# Patient Record
Sex: Male | Born: 1980 | Race: White | Hispanic: No | Marital: Single | Smoking: Never smoker
Health system: Southern US, Community
[De-identification: ages and names within clinical notes are randomized; demographics above are authoritative.]

---

## 2011-03-15 ENCOUNTER — Other Ambulatory Visit: Payer: Self-pay | Admitting: Chiropractic Medicine

## 2011-03-15 ENCOUNTER — Ambulatory Visit
Admission: RE | Admit: 2011-03-15 | Discharge: 2011-03-15 | Disposition: A | Discharge status: Home / Self Care | Payer: 59 | Source: Ambulatory Visit | Attending: Chiropractic Medicine | Admitting: Chiropractic Medicine

## 2011-03-15 DIAGNOSIS — M545 Low back pain: Secondary | ICD-10-CM

## 2011-03-15 DIAGNOSIS — M5412 Radiculopathy, cervical region: Secondary | ICD-10-CM

## 2013-09-07 ENCOUNTER — Encounter: Payer: Self-pay | Admitting: Sports Medicine

## 2013-09-07 ENCOUNTER — Ambulatory Visit (INDEPENDENT_AMBULATORY_CARE_PROVIDER_SITE_OTHER): Payer: PRIVATE HEALTH INSURANCE | Admitting: Sports Medicine

## 2013-09-07 VITALS — BP 134/87 | HR 68 | Ht 68.0 in | Wt 233.0 lb

## 2013-09-07 DIAGNOSIS — Z0000 Encounter for general adult medical examination without abnormal findings: Secondary | ICD-10-CM

## 2013-09-07 DIAGNOSIS — Z299 Encounter for prophylactic measures, unspecified: Secondary | ICD-10-CM

## 2013-09-07 DIAGNOSIS — Z23 Encounter for immunization: Secondary | ICD-10-CM

## 2013-09-07 MED ORDER — TETANUS-DIPHTH-ACELL PERTUSSIS 5-2.5-18.5 LF-MCG/0.5 IM SUSP
0.5000 mL | Freq: Once | INTRAMUSCULAR | Status: AC
  Administered 2013-09-07: 0.5 mL via INTRAMUSCULAR

## 2013-09-07 NOTE — Assessment & Plan Note (Signed)
Complete physical as above. Tdap given today. Return in a year.

## 2013-09-07 NOTE — Progress Notes (Signed)
  Subjective:    CC: Establish care.   HPI:  This pleasant 33 year old male is here for a work physical, he has no complaints and is healthy.  Past medical history, Surgical history, Family history not pertinant except as noted below, Social history, Allergies, and medications have been entered into the medical record, reviewed, and no changes needed.   Review of Systems: No headache, visual changes, nausea, vomiting, diarrhea, constipation, dizziness, abdominal pain, skin rash, fevers, chills, night sweats, swollen lymph nodes, weight loss, chest pain, body aches, joint swelling, muscle aches, shortness of breath, mood changes, visual or auditory hallucinations.  Objective:    General: Well Developed, well nourished, and in no acute distress.  Neuro: Alert and oriented x3, extra-ocular muscles intact, sensation grossly intact.  HEENT: Normocephalic, atraumatic, pupils equal round reactive to light, neck supple, no masses, no lymphadenopathy, thyroid nonpalpable. Oropharynx, nasopharynx, external ear canals are unremarkable. Skin: Warm and dry, no rashes noted.  Cardiac: Regular rate and rhythm, no murmurs rubs or gallops.  Respiratory: Clear to auscultation bilaterally. Not using accessory muscles, speaking in full sentences.  Abdominal: Soft, nontender, nondistended, positive bowel sounds, no masses, no organomegaly.  Musculoskeletal: Shoulder, elbow, wrist, hip, knee, ankle stable, and with full range of motion.  Impression and Recommendations:    The patient was counselled, risk factors were discussed, anticipatory guidance given.

## 2014-09-16 ENCOUNTER — Encounter: Payer: Self-pay | Admitting: Sports Medicine

## 2014-09-16 ENCOUNTER — Ambulatory Visit (INDEPENDENT_AMBULATORY_CARE_PROVIDER_SITE_OTHER): Payer: PRIVATE HEALTH INSURANCE | Admitting: Sports Medicine

## 2014-09-16 VITALS — BP 124/87 | HR 77 | Ht 68.0 in | Wt 236.0 lb

## 2014-09-16 DIAGNOSIS — E669 Obesity, unspecified: Secondary | ICD-10-CM

## 2014-09-16 DIAGNOSIS — Z0000 Encounter for general adult medical examination without abnormal findings: Secondary | ICD-10-CM | POA: Diagnosis not present

## 2014-09-16 DIAGNOSIS — L918 Other hypertrophic disorders of the skin: Secondary | ICD-10-CM | POA: Insufficient documentation

## 2014-09-16 MED ORDER — PHENTERMINE HCL 37.5 MG PO TABS: ORAL_TABLET | ORAL | Status: DC

## 2014-09-16 NOTE — Assessment & Plan Note (Signed)
Cryotherapy as above. 

## 2014-09-16 NOTE — Progress Notes (Signed)
  Subjective:    CC: CPE  HPI:  Healthy male, no complaints except for occasional snoring, he also has been trying to lose significant weight with dieting and exercise but unfortunately his schedule makes it difficult for him to eat regularly, he works second shift, tends to come home and eat junk food.  Skin tag: Left-sided upper shoulder, like this removed.  Past medical history, Surgical history, Family history not pertinant except as noted below, Social history, Allergies, and medications have been entered into the medical record, reviewed, and no changes needed.   Review of Systems: No headache, visual changes, nausea, vomiting, diarrhea, constipation, dizziness, abdominal pain, skin rash, fevers, chills, night sweats, swollen lymph nodes, weight loss, chest pain, body aches, joint swelling, muscle aches, shortness of breath, mood changes, visual or auditory hallucinations.  Objective:    General: Well Developed, well nourished, and in no acute distress.  Neuro: Alert and oriented x3, extra-ocular muscles intact, sensation grossly intact. Cranial nerves II through XII are intact, motor, sensory, and coordinative functions are all intact. HEENT: Normocephalic, atraumatic, pupils equal round reactive to light, neck supple, no masses, no lymphadenopathy, thyroid nonpalpable. Oropharynx, nasopharynx, external ear canals are unremarkable. He does have fairly large tonsils. Skin: Warm and dry, no rashes noted. Skin tag on the left upper shoulder Cardiac: Regular rate and rhythm, no murmurs rubs or gallops.  Respiratory: Clear to auscultation bilaterally. Not using accessory muscles, speaking in full sentences.  Abdominal: Soft, nontender, nondistended, positive bowel sounds, no masses, no organomegaly.  Musculoskeletal: Shoulder, elbow, wrist, hip, knee, ankle stable, and with full range of motion.  Procedure:  Cryodestruction of left upper shoulder skin tag Consent obtained and  verified. Time-out conducted. Noted no overlying erythema, induration, or other signs of local infection. Completed without difficulty using Cryo-Gun. Advised to call if fevers/chills, erythema, induration, drainage, or persistent bleeding.  Impression and Recommendations:    The patient was counselled, risk factors were discussed, anticipatory guidance given.

## 2014-09-16 NOTE — Assessment & Plan Note (Signed)
Normal physical exam. Checking routine blood work.

## 2014-09-16 NOTE — Assessment & Plan Note (Signed)
Starting phentermine. Goal weight is approximately 180-190 pounds. He does have some snoring and daytime sleepiness, we will help him to lose weight and if he still continues to have these symptoms we can discuss sleep apnea testing.

## 2014-09-17 LAB — CBC
HCT: 46.7 % (ref 39.0–52.0)
Hemoglobin: 16 g/dL (ref 13.0–17.0)
MCH: 30 pg (ref 26.0–34.0)
MCHC: 34.3 g/dL (ref 30.0–36.0)
MCV: 87.6 fL (ref 78.0–100.0)
MPV: 8.4 fL — ABNORMAL LOW (ref 8.6–12.4)
Platelets: 226 K/uL (ref 150–400)
RBC: 5.33 MIL/uL (ref 4.22–5.81)
RDW: 13.9 % (ref 11.5–15.5)
WBC: 5.5 K/uL (ref 4.0–10.5)

## 2014-09-17 LAB — COMPREHENSIVE METABOLIC PANEL
Albumin: 4.2 g/dL (ref 3.5–5.2)
Alkaline Phosphatase: 58 U/L (ref 39–117)
BUN: 16 mg/dL (ref 6–23)
Glucose, Bld: 97 mg/dL (ref 70–99)
Sodium: 143 mEq/L (ref 135–145)
Total Bilirubin: 0.9 mg/dL (ref 0.2–1.2)

## 2014-09-17 LAB — COMPREHENSIVE METABOLIC PANEL WITH GFR
ALT: 11 U/L (ref 0–53)
AST: 19 U/L (ref 0–37)
CO2: 28 meq/L (ref 19–32)
Calcium: 9.1 mg/dL (ref 8.4–10.5)
Chloride: 104 meq/L (ref 96–112)
Creat: 1.18 mg/dL (ref 0.50–1.35)
Potassium: 4.3 meq/L (ref 3.5–5.3)
Total Protein: 7.1 g/dL (ref 6.0–8.3)

## 2014-09-17 LAB — LIPID PANEL
Cholesterol: 213 mg/dL — ABNORMAL HIGH (ref 0–200)
HDL: 43 mg/dL (ref >=40)
LDL Cholesterol: 149 mg/dL — ABNORMAL HIGH (ref 0–99)
Total CHOL/HDL Ratio: 5 ratio
Triglycerides: 106 mg/dL (ref <150)
VLDL: 21 mg/dL (ref 0–40)

## 2014-09-17 LAB — HEMOGLOBIN A1C
Hgb A1c MFr Bld: 5.6 % (ref <5.7)
Mean Plasma Glucose: 114 mg/dL (ref <117)

## 2014-09-17 LAB — TSH: TSH: 3.101 u[IU]/mL (ref 0.350–4.500)

## 2014-10-14 ENCOUNTER — Ambulatory Visit (INDEPENDENT_AMBULATORY_CARE_PROVIDER_SITE_OTHER): Payer: PRIVATE HEALTH INSURANCE | Admitting: Sports Medicine

## 2014-10-14 ENCOUNTER — Encounter: Payer: Self-pay | Admitting: Sports Medicine

## 2014-10-14 VITALS — BP 112/81 | HR 92 | Ht 68.0 in | Wt 230.0 lb

## 2014-10-14 DIAGNOSIS — E669 Obesity, unspecified: Secondary | ICD-10-CM

## 2014-10-14 MED ORDER — PHENTERMINE HCL 37.5 MG PO TABS: 37.5000 mg | ORAL_TABLET | Freq: Every day | ORAL | Status: DC

## 2014-10-14 NOTE — Assessment & Plan Note (Signed)
6 pound weight loss after the first month on phentermine. Again discussed the importance of cutting back carbohydrates, he will be getting back into the gym. Weight is 180-190 pounds, he will work harder this next month and try to get 10 pounds down. We are entering the second month on phentermine

## 2014-10-14 NOTE — Progress Notes (Signed)
  Subjective:    CC: Weight check  HPI: Obesity: 6 pound weight loss after the first month on phentermine, no side effects, no chest pain, sleeping okay, no concerns, no complaints.  Past medical history, Surgical history, Family history not pertinant except as noted below, Social history, Allergies, and medications have been entered into the medical record, reviewed, and no changes needed.   Review of Systems: No fevers, chills, night sweats, weight loss, chest pain, or shortness of breath.   Objective:    General: Well Developed, well nourished, and in no acute distress.  Neuro: Alert and oriented x3, extra-ocular muscles intact, sensation grossly intact.  HEENT: Normocephalic, atraumatic, pupils equal round reactive to light, neck supple, no masses, no lymphadenopathy, thyroid nonpalpable.  Skin: Warm and dry, no rashes. Cardiac: Regular rate and rhythm, no murmurs rubs or gallops, no lower extremity edema.  Respiratory: Clear to auscultation bilaterally. Not using accessory muscles, speaking in full sentences.  Impression and Recommendations:

## 2014-11-11 ENCOUNTER — Ambulatory Visit: Payer: PRIVATE HEALTH INSURANCE | Admitting: Sports Medicine

## 2015-08-04 ENCOUNTER — Encounter: Payer: PRIVATE HEALTH INSURANCE | Admitting: Sports Medicine

## 2015-08-11 ENCOUNTER — Encounter: Payer: Self-pay | Admitting: Sports Medicine

## 2015-08-11 ENCOUNTER — Ambulatory Visit (INDEPENDENT_AMBULATORY_CARE_PROVIDER_SITE_OTHER): Payer: PRIVATE HEALTH INSURANCE | Admitting: Sports Medicine

## 2015-08-11 VITALS — BP 106/71 | HR 66 | Resp 16 | Ht 66.0 in | Wt 231.6 lb

## 2015-08-11 DIAGNOSIS — Z0000 Encounter for general adult medical examination without abnormal findings: Secondary | ICD-10-CM | POA: Diagnosis not present

## 2015-08-11 DIAGNOSIS — E785 Hyperlipidemia, unspecified: Secondary | ICD-10-CM | POA: Diagnosis not present

## 2015-08-11 DIAGNOSIS — E669 Obesity, unspecified: Secondary | ICD-10-CM

## 2015-08-11 LAB — CBC
HCT: 46.8 % (ref 38.5–50.0)
Hemoglobin: 16.3 g/dL (ref 13.2–17.1)
MCH: 30.6 pg (ref 27.0–33.0)
MCHC: 34.8 g/dL (ref 32.0–36.0)
MCV: 87.8 fL (ref 80.0–100.0)
MPV: 8.2 fL (ref 7.5–12.5)
Platelets: 208 10*3/uL (ref 140–400)
RBC: 5.33 MIL/uL (ref 4.20–5.80)
RDW: 13.5 % (ref 11.0–15.0)
WBC: 5.9 10*3/uL (ref 3.8–10.8)

## 2015-08-11 LAB — HEMOGLOBIN A1C
Hgb A1c MFr Bld: 5.5 % (ref <5.7)
Mean Plasma Glucose: 111 mg/dL

## 2015-08-11 MED ORDER — PHENTERMINE HCL 37.5 MG PO TABS: ORAL_TABLET | ORAL | Status: DC

## 2015-08-11 NOTE — Assessment & Plan Note (Signed)
Complete physical as above. 

## 2015-08-11 NOTE — Assessment & Plan Note (Addendum)
Restarting phentermine,did get excessive sweating so he will start with one half tab daily for a week and then possibly go up to one half tab twice a day. Return monthly for weight checks, checking routine blood work. We discussed the mechanisms of the other weight loss medications today

## 2015-08-11 NOTE — Progress Notes (Signed)
  Subjective:    CC: CPE   HPI:  This is a pleasant 35 year old male here for a physical. He is fasting.  Obesity: Desires to restart weight loss treatment.  Past medical history, Surgical history, Family history not pertinant except as noted below, Social history, Allergies, and medications have been entered into the medical record, reviewed, and no changes needed.   Review of Systems: No headache, visual changes, nausea, vomiting, diarrhea, constipation, dizziness, abdominal pain, skin rash, fevers, chills, night sweats, swollen lymph nodes, weight loss, chest pain, body aches, joint swelling, muscle aches, shortness of breath, mood changes, visual or auditory hallucinations.  Objective:    General: Well Developed, well nourished, and in no acute distress.  Neuro: Alert and oriented x3, extra-ocular muscles intact, sensation grossly intact. Cranial nerves II through XII are intact, motor, sensory, and coordinative functions are all intact. HEENT: Normocephalic, atraumatic, pupils equal round reactive to light, neck supple, no masses, no lymphadenopathy, thyroid nonpalpable. Oropharynx, nasopharynx, external ear canals are unremarkable. Skin: Warm and dry, no rashes noted.  Cardiac: Regular rate and rhythm, no murmurs rubs or gallops.  Respiratory: Clear to auscultation bilaterally. Not using accessory muscles, speaking in full sentences.  Abdominal: Soft, nontender, nondistended, positive bowel sounds, no masses, no organomegaly.  Musculoskeletal: Shoulder, elbow, wrist, hip, knee, ankle stable, and with full range of motion.  Impression and Recommendations:    The patient was counselled, risk factors were discussed, anticipatory guidance given.

## 2015-08-12 DIAGNOSIS — E785 Hyperlipidemia, unspecified: Secondary | ICD-10-CM | POA: Insufficient documentation

## 2015-08-12 LAB — TSH: TSH: 2.22 mIU/L (ref 0.40–4.50)

## 2015-08-12 LAB — COMPREHENSIVE METABOLIC PANEL
ALT: 9 U/L (ref 9–46)
AST: 17 U/L (ref 10–40)
Albumin: 4.3 g/dL (ref 3.6–5.1)
Alkaline Phosphatase: 50 U/L (ref 40–115)
BUN: 16 mg/dL (ref 7–25)
CO2: 25 mmol/L (ref 20–31)
Calcium: 9.3 mg/dL (ref 8.6–10.3)
Chloride: 100 mmol/L (ref 98–110)
Creat: 1.12 mg/dL (ref 0.60–1.35)
Glucose, Bld: 68 mg/dL (ref 65–99)
Potassium: 4.2 mmol/L (ref 3.5–5.3)
Sodium: 139 mmol/L (ref 135–146)
Total Bilirubin: 0.8 mg/dL (ref 0.2–1.2)
Total Protein: 7.1 g/dL (ref 6.1–8.1)

## 2015-08-12 LAB — LIPID PANEL
Cholesterol: 217 mg/dL — ABNORMAL HIGH (ref 125–200)
HDL: 49 mg/dL (ref >=40)
LDL Cholesterol: 143 mg/dL — ABNORMAL HIGH (ref <130)
Total CHOL/HDL Ratio: 4.4 ratio (ref <=5.0)
Triglycerides: 123 mg/dL (ref <150)
VLDL: 25 mg/dL (ref <30)

## 2015-08-12 LAB — VITAMIN D 25 HYDROXY (VIT D DEFICIENCY, FRACTURES): Vit D, 25-Hydroxy: 24 ng/mL — ABNORMAL LOW (ref 30–100)

## 2015-08-12 MED ORDER — VITAMIN D (ERGOCALCIFEROL) 1.25 MG (50000 UNIT) PO CAPS: 50000.0000 [IU] | ORAL_CAPSULE | ORAL | Status: DC

## 2015-08-12 NOTE — Addendum Note (Signed)
Addended by: Monica BectonHEKKEKANDAM, Demetrie Borge J on: 08/12/2015 08:37 AM   Modules accepted: Orders

## 2015-09-08 ENCOUNTER — Ambulatory Visit (INDEPENDENT_AMBULATORY_CARE_PROVIDER_SITE_OTHER): Payer: PRIVATE HEALTH INSURANCE | Admitting: Sports Medicine

## 2015-09-08 ENCOUNTER — Encounter: Payer: Self-pay | Admitting: Sports Medicine

## 2015-09-08 VITALS — BP 111/76 | HR 76 | Resp 16 | Wt 222.2 lb

## 2015-09-08 DIAGNOSIS — E669 Obesity, unspecified: Secondary | ICD-10-CM

## 2015-09-08 DIAGNOSIS — S76311A Strain of muscle, fascia and tendon of the posterior muscle group at thigh level, right thigh, initial encounter: Secondary | ICD-10-CM

## 2015-09-08 MED ORDER — PHENTERMINE HCL 37.5 MG PO TABS: 37.5000 mg | ORAL_TABLET | Freq: Every day | ORAL | Status: DC

## 2015-09-08 NOTE — Assessment & Plan Note (Signed)
9 pound weight loss after the first month on phentermine, continue medication as we enter the second month.

## 2015-09-08 NOTE — Progress Notes (Signed)
  Subjective:    CC: Follow-up  HPI: Obesity: 9 pound weight loss after the first month, no longer having excessive sweating.  Hamstring strain: While playing softball this weekend, pain is at the mid semimembranosus muscle belly. No bruising. Mild, persistent.  Past medical history, Surgical history, Family history not pertinant except as noted below, Social history, Allergies, and medications have been entered into the medical record, reviewed, and no changes needed.   Review of Systems: No fevers, chills, night sweats, weight loss, chest pain, or shortness of breath.   Objective:    General: Well Developed, well nourished, and in no acute distress.  Neuro: Alert and oriented x3, extra-ocular muscles intact, sensation grossly intact.  HEENT: Normocephalic, atraumatic, pupils equal round reactive to light, neck supple, no masses, no lymphadenopathy, thyroid nonpalpable.  Skin: Warm and dry, no rashes. Cardiac: Regular rate and rhythm, no murmurs rubs or gallops, no lower extremity edema.  Respiratory: Clear to auscultation bilaterally. Not using accessory muscles, speaking in full sentences. Right thigh: Tender to palpation over the mid semimembranosus muscle belly without palpable gap in the muscle, excellent strength hip extension and knee flexion. No visible bruising or other abnormalities.  The right thigh was strapped with compressive dressing.  Impression and Recommendations:    I spent 25 minutes with this patient, greater than 50% was face-to-face time counseling regarding the above diagnoses

## 2015-09-08 NOTE — Assessment & Plan Note (Signed)
Grade 1 strain. Strap with compressive dressing, rehabilitation exercises given.

## 2015-10-07 ENCOUNTER — Ambulatory Visit (INDEPENDENT_AMBULATORY_CARE_PROVIDER_SITE_OTHER): Payer: PRIVATE HEALTH INSURANCE | Admitting: Sports Medicine

## 2015-10-07 VITALS — BP 100/65 | HR 71 | Resp 18 | Wt 215.0 lb

## 2015-10-07 DIAGNOSIS — E669 Obesity, unspecified: Secondary | ICD-10-CM | POA: Diagnosis not present

## 2015-10-07 MED ORDER — PHENTERMINE HCL 37.5 MG PO TABS: 37.5000 mg | ORAL_TABLET | Freq: Every day | ORAL | Status: DC

## 2015-10-07 NOTE — Progress Notes (Signed)
  Subjective:    CC: Weight check  HPI: Good additional weight loss after 2 months of phentermine, no side effects.  Past medical history, Surgical history, Family history not pertinant except as noted below, Social history, Allergies, and medications have been entered into the medical record, reviewed, and no changes needed.   Review of Systems: No fevers, chills, night sweats, weight loss, chest pain, or shortness of breath.   Objective:    General: Well Developed, well nourished, and in no acute distress.  Neuro: Alert and oriented x3, extra-ocular muscles intact, sensation grossly intact.  HEENT: Normocephalic, atraumatic, pupils equal round reactive to light, neck supple, no masses, no lymphadenopathy, thyroid nonpalpable.  Skin: Warm and dry, no rashes. Cardiac: Regular rate and rhythm, no murmurs rubs or gallops, no lower extremity edema.  Respiratory: Clear to auscultation bilaterally. Not using accessory muscles, speaking in full sentences.  Impression and Recommendations:

## 2015-10-07 NOTE — Assessment & Plan Note (Signed)
Additional 7 pound weight loss after the second month of phentermine, continue medication.

## 2015-11-04 ENCOUNTER — Ambulatory Visit (INDEPENDENT_AMBULATORY_CARE_PROVIDER_SITE_OTHER): Payer: PRIVATE HEALTH INSURANCE | Admitting: Sports Medicine

## 2015-11-04 ENCOUNTER — Encounter: Payer: Self-pay | Admitting: Sports Medicine

## 2015-11-04 DIAGNOSIS — E669 Obesity, unspecified: Secondary | ICD-10-CM | POA: Diagnosis not present

## 2015-11-04 MED ORDER — VITAMIN D (ERGOCALCIFEROL) 1.25 MG (50000 UNIT) PO CAPS: 50000.0000 [IU] | ORAL_CAPSULE | ORAL | 0 refills | Status: DC

## 2015-11-04 MED ORDER — PHENTERMINE HCL 37.5 MG PO TABS: 37.5000 mg | ORAL_TABLET | Freq: Every day | ORAL | 0 refills | Status: DC

## 2015-11-04 NOTE — Progress Notes (Signed)
  Subjective:    CC: Weight check  HPI: This pleasant 35 year old male continues to lose weight, in fact she's only taken phentermine for the first 2 weeks of the last month. He does note some weakness after working out when he doesn't eat, and wonders if he can skip the phentermine and workout days.  He also tells me well doing to rows he felt a pain in his low back which continues to get better. No radiculopathy, bowel or bladder dysfunction, saddle numbness.  Past medical history, Surgical history, Family history not pertinant except as noted below, Social history, Allergies, and medications have been entered into the medical record, reviewed, and no changes needed.   Review of Systems: No fevers, chills, night sweats, weight loss, chest pain, or shortness of breath.   Objective:    General: Well Developed, well nourished, and in no acute distress.  Neuro: Alert and oriented x3, extra-ocular muscles intact, sensation grossly intact.  HEENT: Normocephalic, atraumatic, pupils equal round reactive to light, neck supple, no masses, no lymphadenopathy, thyroid nonpalpable.  Skin: Warm and dry, no rashes. Cardiac: Regular rate and rhythm, no murmurs rubs or gallops, no lower extremity edema.  Respiratory: Clear to auscultation bilaterally. Not using accessory muscles, speaking in full sentences.   Impression and Recommendations:    Obesity Good continued weight loss as we enter the fourth month, he is not even taking phentermine every day. We discussed holding off on phentermine on workout days and consuming a bit more.  I spent 25 minutes with this patient, greater than 50% was face-to-face time counseling regarding the above diagnoses

## 2015-11-04 NOTE — Assessment & Plan Note (Signed)
Good continued weight loss as we enter the fourth month, he is not even taking phentermine every day. We discussed holding off on phentermine on workout days and consuming a bit more.

## 2015-12-02 ENCOUNTER — Ambulatory Visit: Payer: PRIVATE HEALTH INSURANCE | Admitting: Sports Medicine

## 2016-04-10 ENCOUNTER — Ambulatory Visit (INDEPENDENT_AMBULATORY_CARE_PROVIDER_SITE_OTHER): Payer: PRIVATE HEALTH INSURANCE | Admitting: Sports Medicine

## 2016-04-10 DIAGNOSIS — M7662 Achilles tendinitis, left leg: Secondary | ICD-10-CM

## 2016-04-10 DIAGNOSIS — M6788 Other specified disorders of synovium and tendon, other site: Secondary | ICD-10-CM | POA: Insufficient documentation

## 2016-04-10 MED ORDER — DICLOFENAC SODIUM 75 MG PO TBEC: 75.0000 mg | DELAYED_RELEASE_TABLET | Freq: Two times a day (BID) | ORAL | 3 refills | Status: DC

## 2016-04-10 NOTE — Assessment & Plan Note (Signed)
This likely occurred with wearing 0 drop cleats well playing softball. He should continue his regular shoes, we will use a heel lift, rehabilitation exercises, diclofenac. He will ice his heels after further softball games.

## 2016-04-10 NOTE — Progress Notes (Signed)
  Subjective:    CC: Left heel pain  HPI: This is a pleasant 36 year old male, this weekend he was playing softball in 0 drop cleats. Unfortunately he developed severe pain on the posterior aspect of his left ankle, that worsened overnight, no swelling. Pain is severe, persistent, localized without radiation.  Past medical history:  Negative.  See flowsheet/record as well for more information.  Surgical history: Negative.  See flowsheet/record as well for more information.  Family history: Negative.  See flowsheet/record as well for more information.  Social history: Negative.  See flowsheet/record as well for more information.  Allergies, and medications have been entered into the medical record, reviewed, and no changes needed.   Review of Systems: No fevers, chills, night sweats, weight loss, chest pain, or shortness of breath.   Objective:    General: Well Developed, well nourished, and in no acute distress.  Neuro: Alert and oriented x3, extra-ocular muscles intact, sensation grossly intact.  HEENT: Normocephalic, atraumatic, pupils equal round reactive to light, neck supple, no masses, no lymphadenopathy, thyroid nonpalpable.  Skin: Warm and dry, no rashes. Cardiac: Regular rate and rhythm, no murmurs rubs or gallops, no lower extremity edema.  Respiratory: Clear to auscultation bilaterally. Not using accessory muscles, speaking in full sentences. Left Ankle: No visible erythema or swelling. Range of motion is full in all directions. Strength is 5/5 in all directions. Stable lateral and medial ligaments; squeeze test and kleiger test unremarkable; Talar dome nontender; No pain at base of 5th MT; No tenderness over cuboid; No tenderness over N spot or navicular prominence No tenderness on posterior aspects of lateral and medial malleolus No sign of peroneal tendon subluxations; Negative tarsal tunnel tinel's Exquisite tenderness to palpation at the insertion of the Achilles, no  tenderness at the retrocalcaneal bursa, no swelling.  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 He should continue his regular shoes, we will use a heel lift, rehabilitation exercises, diclofenac. He will ice his heels after further softball games.

## 2016-05-08 ENCOUNTER — Ambulatory Visit (INDEPENDENT_AMBULATORY_CARE_PROVIDER_SITE_OTHER): Payer: PRIVATE HEALTH INSURANCE | Admitting: Sports Medicine

## 2016-05-08 ENCOUNTER — Encounter: Payer: Self-pay | Admitting: Sports Medicine

## 2016-05-08 DIAGNOSIS — M7662 Achilles tendinitis, left leg: Secondary | ICD-10-CM | POA: Diagnosis not present

## 2016-05-08 DIAGNOSIS — M7581 Other shoulder lesions, right shoulder: Secondary | ICD-10-CM | POA: Diagnosis not present

## 2016-05-08 DIAGNOSIS — M6788 Other specified disorders of synovium and tendon, other site: Secondary | ICD-10-CM

## 2016-05-08 NOTE — Progress Notes (Signed)
  Subjective:    CC: Follow-up  HPI: Right shoulder pain: Localized over the deltoid, worse with overhead activities, moderate, persistent without radiation. No discrete injuries.  Achilles tendinosis: Resolved  Past medical history:  Negative.  See flowsheet/record as well for more information.  Surgical history: Negative.  See flowsheet/record as well for more information.  Family history: Negative.  See flowsheet/record as well for more information.  Social history: Negative.  See flowsheet/record as well for more information.  Allergies, and medications have been entered into the medical record, reviewed, and no changes needed.   Review of Systems: No fevers, chills, night sweats, weight loss, chest pain, or shortness of breath.   Objective:    General: Well Developed, well nourished, and in no acute distress.  Neuro: Alert and oriented x3, extra-ocular muscles intact, sensation grossly intact.  HEENT: Normocephalic, atraumatic, pupils equal round reactive to light, neck supple, no masses, no lymphadenopathy, thyroid nonpalpable.  Skin: Warm and dry, no rashes. Cardiac: Regular rate and rhythm, no murmurs rubs or gallops, no lower extremity edema.  Respiratory: Clear to auscultation bilaterally. Not using accessory muscles, speaking in full sentences. Right Shoulder: Inspection reveals no abnormalities, atrophy or asymmetry. Palpation is normal with no tenderness over AC joint or bicipital groove. ROM is full in all planes. Rotator cuff strength weak to external rotation with reproduction of pain. Positive Neer and Hawkin's tests, empty can. Speeds and Yergason's tests normal. No labral pathology noted with negative Obrien's, negative crank, negative clunk, and good stability. Normal scapular function observed. No painful arc and no drop arm sign. 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 He did the rehabilitation exercises, diclofenac, and iced his heels. Return as needed for this.  Tendinitis of right infraspinatus tendon [name] will do rotator cuff rehabilitation with his [name], he will also drop his left elbow during swings. Return in one month if no better.

## 2016-05-08 NOTE — Assessment & Plan Note (Signed)
Clarence Benjamin will do rotator cuff rehabilitation with his [name], he will also drop his left elbow during swings. Return in one month if no better.

## 2016-05-08 NOTE — Assessment & Plan Note (Signed)
Resolved with discontinuing 0 drop cleats. He did the rehabilitation exercises, diclofenac, and iced his heels. Return as needed for this.

## 2016-08-17 ENCOUNTER — Ambulatory Visit (INDEPENDENT_AMBULATORY_CARE_PROVIDER_SITE_OTHER): Payer: PRIVATE HEALTH INSURANCE | Admitting: Sports Medicine

## 2016-08-17 ENCOUNTER — Encounter: Payer: Self-pay | Admitting: Sports Medicine

## 2016-08-17 VITALS — BP 118/68 | HR 68 | Resp 18 | Ht 67.0 in | Wt 218.0 lb

## 2016-08-17 DIAGNOSIS — L723 Sebaceous cyst: Secondary | ICD-10-CM

## 2016-08-17 DIAGNOSIS — M6788 Other specified disorders of synovium and tendon, other site: Secondary | ICD-10-CM

## 2016-08-17 DIAGNOSIS — M7662 Achilles tendinitis, left leg: Secondary | ICD-10-CM | POA: Diagnosis not present

## 2016-08-17 DIAGNOSIS — E7800 Pure hypercholesterolemia, unspecified: Secondary | ICD-10-CM | POA: Diagnosis not present

## 2016-08-17 DIAGNOSIS — Z0000 Encounter for general adult medical examination without abnormal findings: Secondary | ICD-10-CM

## 2016-08-17 MED ORDER — MELOXICAM 15 MG PO TABS: ORAL_TABLET | ORAL | 3 refills | Status: DC

## 2016-08-17 NOTE — Assessment & Plan Note (Signed)
Routine physical as above. Routine blood work ordered.

## 2016-08-17 NOTE — Assessment & Plan Note (Signed)
Midline low back, patient will return in a 30 minute visit for cyst excision.

## 2016-08-17 NOTE — Progress Notes (Signed)
  Subjective:    CC: Annual physical   HPI:  Clarence ParodySidney returns, he's here for his physical.  Skin lesion: For several months has noted a minimally tender mass on the midline of his low back. No constitutional symptoms.  Heel pain: I diagnosed him in the past with insertional Achilles tendinosis, he has stopped doing his exercises and stopped wearing his heel lifts, pain has come back.  Pain is moderate, persistent without radiation.  Hyperlipidemia: Stable but due for recheck.  Past medical history:  Negative.  See flowsheet/record as well for more information.  Surgical history: Negative.  See flowsheet/record as well for more information.  Family history: Negative.  See flowsheet/record as well for more information.  Social history: Negative.  See flowsheet/record as well for more information.  Allergies, and medications have been entered into the medical record, reviewed, and no changes needed.    Review of Systems: No headache, visual changes, nausea, vomiting, diarrhea, constipation, dizziness, abdominal pain, skin rash, fevers, chills, night sweats, swollen lymph nodes, weight loss, chest pain, body aches, joint swelling, muscle aches, shortness of breath, mood changes, visual or auditory hallucinations.  Objective:    General: Well Developed, well nourished, and in no acute distress.  Neuro: Alert and oriented x3, extra-ocular muscles intact, sensation grossly intact. Cranial nerves II through XII are intact, motor, sensory, and coordinative functions are all intact. HEENT: Normocephalic, atraumatic, pupils equal round reactive to light, neck supple, no masses, no lymphadenopathy, thyroid nonpalpable. Oropharynx, nasopharynx, external ear canals are unremarkable. Skin: Warm and dry, no rashes noted. There is a large sebaceous cyst in the midline of his low back, well-defined, movable, approximately 3 cm to 4 cm across, no overlying erythema. Cardiac: Regular rate and rhythm, no murmurs  rubs or gallops.  Respiratory: Clear to auscultation bilaterally. Not using accessory muscles, speaking in full sentences.  Abdominal: Soft, nontender, nondistended, positive bowel sounds, no masses, no organomegaly.  Musculoskeletal: Shoulder, elbow, wrist, hip, knee, ankle stable, and with full range of motion.  Impression and Recommendations:    The patient was counselled, risk factors were discussed, anticipatory guidance given.  Annual physical exam Routine physical as above. Routine blood work ordered.   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 Return in one month for this.  Sebaceous cyst Midline low back, patient will return in a 30 minute visit for cyst excision.

## 2016-08-17 NOTE — Assessment & Plan Note (Signed)
Meloxicam, heel lifts, he will get back into his rehabilitation exercises. Return in one month for this.

## 2016-09-12 ENCOUNTER — Telehealth: Payer: Self-pay | Admitting: Sports Medicine

## 2016-09-12 NOTE — Telephone Encounter (Signed)
I told him 48 h he should keep it dry.  We can just have him apply tegaderm for the first 2 days when showering.

## 2016-09-12 NOTE — Telephone Encounter (Signed)
Patient called about his scheduled appointment on Friday for a cyst removal. He stated that he was told he would not be able to shower until he healed. He said with the type of work he does he cannot go without showering and wanted to know if it is feasible to postpone the procedure until cooler months. Let me know what you want to do, and I will follow up. Thanks!

## 2016-09-14 ENCOUNTER — Ambulatory Visit: Payer: PRIVATE HEALTH INSURANCE | Admitting: Sports Medicine

## 2016-09-14 LAB — COMPREHENSIVE METABOLIC PANEL
Albumin: 4.1 g/dL (ref 3.6–5.1)
Alkaline Phosphatase: 76 U/L (ref 40–115)
Calcium: 8.7 mg/dL (ref 8.6–10.3)
Chloride: 105 mmol/L (ref 98–110)
Potassium: 4.1 mmol/L (ref 3.5–5.3)
Total Protein: 6.5 g/dL (ref 6.1–8.1)

## 2016-09-14 LAB — COMPREHENSIVE METABOLIC PANEL WITH GFR
ALT: 8 U/L — ABNORMAL LOW (ref 9–46)
AST: 16 U/L (ref 10–40)
BUN: 14 mg/dL (ref 7–25)
CO2: 27 mmol/L (ref 20–31)
Creat: 1.24 mg/dL (ref 0.60–1.35)
Glucose, Bld: 90 mg/dL (ref 65–99)
Sodium: 141 mmol/L (ref 135–146)
Total Bilirubin: 0.5 mg/dL (ref 0.2–1.2)

## 2016-09-14 LAB — CBC
HCT: 45.1 % (ref 38.5–50.0)
Hemoglobin: 15.1 g/dL (ref 13.2–17.1)
MCH: 30.2 pg (ref 27.0–33.0)
MCHC: 33.5 g/dL (ref 32.0–36.0)
MCV: 90.2 fL (ref 80.0–100.0)
MPV: 8.3 fL (ref 7.5–12.5)
Platelets: 225 10*3/uL (ref 140–400)
RBC: 5 MIL/uL (ref 4.20–5.80)
RDW: 13.5 % (ref 11.0–15.0)
WBC: 6.6 K/uL (ref 3.8–10.8)

## 2016-09-14 LAB — LIPID PANEL W/REFLEX DIRECT LDL
Cholesterol: 187 mg/dL (ref <200)
HDL: 47 mg/dL (ref >40)
LDL-Cholesterol: 124 mg/dL — ABNORMAL HIGH
Non-HDL Cholesterol (Calc): 140 mg/dL — ABNORMAL HIGH (ref <130)
Total CHOL/HDL Ratio: 4 Ratio (ref <5.0)
Triglycerides: 64 mg/dL (ref <150)

## 2016-09-14 LAB — TSH: TSH: 1.97 mIU/L (ref 0.40–4.50)

## 2016-09-15 LAB — HIV ANTIBODY (ROUTINE TESTING W REFLEX): HIV 1&2 Ab, 4th Generation: NONREACTIVE

## 2016-09-15 LAB — HEMOGLOBIN A1C
Hgb A1c MFr Bld: 5.2 % (ref <5.7)
Mean Plasma Glucose: 103 mg/dL

## 2016-09-15 LAB — VITAMIN D 25 HYDROXY (VIT D DEFICIENCY, FRACTURES): Vit D, 25-Hydroxy: 26 ng/mL — ABNORMAL LOW (ref 30–100)

## 2016-09-16 MED ORDER — VITAMIN D (ERGOCALCIFEROL) 1.25 MG (50000 UNIT) PO CAPS: 50000.0000 [IU] | ORAL_CAPSULE | ORAL | 0 refills | Status: DC

## 2016-09-16 NOTE — Addendum Note (Signed)
 Addended by: Monica BectonHEKKEKANDAM,  J on: 09/16/2016 04:08 PM   Modules accepted: Orders

## 2016-09-18 ENCOUNTER — Ambulatory Visit: Payer: PRIVATE HEALTH INSURANCE | Admitting: Sports Medicine

## 2016-09-18 DIAGNOSIS — Z0189 Encounter for other specified special examinations: Secondary | ICD-10-CM

## 2016-12-03 ENCOUNTER — Other Ambulatory Visit: Payer: Self-pay | Admitting: Sports Medicine

## 2017-09-03 ENCOUNTER — Encounter: Payer: Self-pay | Admitting: Sports Medicine

## 2017-09-03 ENCOUNTER — Ambulatory Visit (INDEPENDENT_AMBULATORY_CARE_PROVIDER_SITE_OTHER): Payer: PRIVATE HEALTH INSURANCE | Admitting: Sports Medicine

## 2017-09-03 DIAGNOSIS — Z0000 Encounter for general adult medical examination without abnormal findings: Secondary | ICD-10-CM | POA: Diagnosis not present

## 2017-09-03 NOTE — Assessment & Plan Note (Signed)
Routine physical as above. Checking routine labs.  

## 2017-09-03 NOTE — Progress Notes (Signed)
Subjective:    CC: Annual physical exam  HPI:  Clarence Benjamin is here for his physical, he has no complaints.  I reviewed the past medical history, family history, social history, surgical history, and allergies today and no changes were needed.  Please see the problem list section below in epic for further details.  Past Medical History: No past medical history on file. Past Surgical History: No past surgical history on file. Social History: Social History   Socioeconomic History  . Marital status: Single    Spouse name: Not on file  . Number of children: Not on file  . Years of education: Not on file  . Highest education level: Not on file  Occupational History  . Not on file  Social Needs  . Financial resource strain: Not on file  . Food insecurity:    Worry: Not on file    Inability: Not on file  . Transportation needs:    Medical: Not on file    Non-medical: Not on file  Tobacco Use  . Smoking status: Never Smoker  . Smokeless tobacco: Never Used  Substance and Sexual Activity  . Alcohol use: Yes    Alcohol/week: 0.6 oz    Types: 1 Standard drinks or equivalent per week  . Drug use: No  . Sexual activity: Yes    Partners: Female  Lifestyle  . Physical activity:    Days per week: Not on file    Minutes per session: Not on file  . Stress: Not on file  Relationships  . Social connections:    Talks on phone: Not on file    Gets together: Not on file    Attends religious service: Not on file    Active member of club or organization: Not on file    Attends meetings of clubs or organizations: Not on file    Relationship status: Not on file  Other Topics Concern  . Not on file  Social History Narrative  . Not on file   Family History: Family History  Problem Relation Age of Onset  . Alcoholism Father        mother  . Liver cancer Father   . Heart attack Unknown        grandfather   Allergies: No Known Allergies Medications: See med rec.  Review of  Systems: No headache, visual changes, nausea, vomiting, diarrhea, constipation, dizziness, abdominal pain, skin rash, fevers, chills, night sweats, swollen lymph nodes, weight loss, chest pain, body aches, joint swelling, muscle aches, shortness of breath, mood changes, visual or auditory hallucinations.  Objective:    General: Well Developed, well nourished, and in no acute distress.  Neuro: Alert and oriented x3, extra-ocular muscles intact, sensation grossly intact. Cranial nerves II through XII are intact, motor, sensory, and coordinative functions are all intact. HEENT: Normocephalic, atraumatic, pupils equal round reactive to light, neck supple, no masses, no lymphadenopathy, thyroid nonpalpable. Oropharynx, nasopharynx, external ear canals are unremarkable. Skin: Warm and dry, no rashes noted.  Cardiac: Regular rate and rhythm, no murmurs rubs or gallops.  Respiratory: Clear to auscultation bilaterally. Not using accessory muscles, speaking in full sentences.  Abdominal: Soft, nontender, nondistended, positive bowel sounds, no masses, no organomegaly.  Musculoskeletal: Shoulder, elbow, wrist, hip, knee, ankle stable, and with full range of motion.  Impression and Recommendations:    The patient was counselled, risk factors were discussed, anticipatory guidance given.  Annual physical exam Routine physical as above. Checking routine labs. ___________________________________________ Ihor Austinhomas J. Benjamin Stainhekkekandam, M.D., ABFM.,  CAQSM. Primary Care and [place] Instructor of [place] [employer] [facility] [employer]

## 2017-09-05 ENCOUNTER — Telehealth: Payer: Self-pay | Admitting: *Deleted

## 2017-09-05 MED ORDER — PHENTERMINE HCL 37.5 MG PO TABS: ORAL_TABLET | ORAL | 0 refills | Status: DC

## 2017-09-05 NOTE — Telephone Encounter (Signed)
Pt left vm stating that he does want to try Phentermine.

## 2017-09-05 NOTE — Telephone Encounter (Signed)
Pt.notified

## 2017-09-05 NOTE — Telephone Encounter (Signed)
Prescription sent in, he needs to make a 1 month follow-up for a weight check.

## 2017-09-07 LAB — CBC
HCT: 42.3 % (ref 38.5–50.0)
Hemoglobin: 14.8 g/dL (ref 13.2–17.1)
MCH: 30.3 pg (ref 27.0–33.0)
MCHC: 35 g/dL (ref 32.0–36.0)
MCV: 86.7 fL (ref 80.0–100.0)
MPV: 8.6 fL (ref 7.5–12.5)
Platelets: 222 10*3/uL (ref 140–400)
RBC: 4.88 Million/uL (ref 4.20–5.80)
RDW: 12.5 % (ref 11.0–15.0)
WBC: 7.2 10*3/uL (ref 3.8–10.8)

## 2017-09-07 LAB — COMPREHENSIVE METABOLIC PANEL
AG Ratio: 1.5 (calc) (ref 1.0–2.5)
ALT: 10 U/L (ref 9–46)
Albumin: 4.1 g/dL (ref 3.6–5.1)
BUN: 16 mg/dL (ref 7–25)
Calcium: 9 mg/dL (ref 8.6–10.3)
Creat: 1.17 mg/dL (ref 0.60–1.35)
Glucose, Bld: 105 mg/dL — ABNORMAL HIGH (ref 65–99)
Potassium: 4.4 mmol/L (ref 3.5–5.3)
Sodium: 140 mmol/L (ref 135–146)
Total Protein: 6.8 g/dL (ref 6.1–8.1)

## 2017-09-07 LAB — COMPREHENSIVE METABOLIC PANEL WITH GFR
AST: 17 U/L (ref 10–40)
Alkaline phosphatase (APISO): 61 U/L (ref 40–115)
CO2: 31 mmol/L (ref 20–32)
Chloride: 105 mmol/L (ref 98–110)
Globulin: 2.7 g/dL (ref 1.9–3.7)
Total Bilirubin: 0.8 mg/dL (ref 0.2–1.2)

## 2017-09-07 LAB — LIPID PANEL W/REFLEX DIRECT LDL
Cholesterol: 180 mg/dL (ref <200)
HDL: 51 mg/dL (ref >40)
LDL Cholesterol (Calc): 116 mg/dL (calc) — ABNORMAL HIGH
Non-HDL Cholesterol (Calc): 129 mg/dL (ref <130)
Total CHOL/HDL Ratio: 3.5 (calc) (ref <5.0)
Triglycerides: 50 mg/dL (ref <150)

## 2017-09-07 LAB — HEMOGLOBIN A1C
Hgb A1c MFr Bld: 5.1 %{Hb} (ref <5.7)
Mean Plasma Glucose: 100 (calc)
eAG (mmol/L): 5.5 (calc)

## 2017-09-07 LAB — TSH: TSH: 1.1 m[IU]/L (ref 0.40–4.50)

## 2017-10-21 ENCOUNTER — Ambulatory Visit: Payer: PRIVATE HEALTH INSURANCE | Admitting: Sports Medicine

## 2017-10-21 ENCOUNTER — Ambulatory Visit (INDEPENDENT_AMBULATORY_CARE_PROVIDER_SITE_OTHER): Payer: PRIVATE HEALTH INSURANCE

## 2017-10-21 ENCOUNTER — Encounter: Payer: Self-pay | Admitting: Sports Medicine

## 2017-10-21 DIAGNOSIS — T675XXA Heat exhaustion, unspecified, initial encounter: Secondary | ICD-10-CM

## 2017-10-21 DIAGNOSIS — X58XXXA Exposure to other specified factors, initial encounter: Secondary | ICD-10-CM | POA: Diagnosis not present

## 2017-10-21 DIAGNOSIS — S52514A Nondisplaced fracture of right radial styloid process, initial encounter for closed fracture: Secondary | ICD-10-CM | POA: Diagnosis not present

## 2017-10-21 DIAGNOSIS — S6991XA Unspecified injury of right wrist, hand and finger(s), initial encounter: Secondary | ICD-10-CM

## 2017-10-21 DIAGNOSIS — S52601A Unspecified fracture of lower end of right ulna, initial encounter for closed fracture: Secondary | ICD-10-CM

## 2017-10-21 DIAGNOSIS — S52501A Unspecified fracture of the lower end of right radius, initial encounter for closed fracture: Secondary | ICD-10-CM | POA: Diagnosis not present

## 2017-10-21 NOTE — Patient Instructions (Signed)
Heat Exhaustion Information °WHAT IS HEAT EXHAUSTION? °Heat exhaustion happens when your body gets overheated from hot weather or from exercise. Heat exhaustion can lead to heat stroke, a life-threatening condition that requires emergency care. °Heat exhaustion is more likely to develop when: °· You are exercising or being active. °· You are in hot or humid weather. °· You are in bright sunshine. °· You are not drinking enough water. ° °WHO IS AT RISK FOR THIS CONDITION? °This condition is more likely to develop in: °· People who exercise in hot or humid weather. °· People who exercise beyond their fitness level. °· People who wear clothing that does not allow sweat to evaporate. °· People who are dehydrated. °· People who drink a lot of alcoholic beverages or beverages that have caffeine. This can lead to dehydration. °· People who are age 65 or older. °· Children. °· People who have a medical condition such as heart disease, poor circulation, sickle cell disease, or high blood pressure. °· People who have a fever. °· People who are very overweight (obese). ° °WHAT ARE THE SYMPTOMS OF THIS CONDITION? °Symptoms of heat exhaustion include: °· Heavy sweating along with feeling weak, dizzy, light-headed, and nauseous. °· Rapid heartbeat. °· Headache. °· Urine that is darker than normal. °· Muscle cramps, such as in the leg or side (flank). °· Moist, cool, and clammy skin. °· Fatigue. °· Thirst. °· Confusion. °· Fainting. ° °WHAT SHOULD I DO IF I THINK I HAVE THIS CONDITION? °If you think that you have heat exhaustion, call your health care provider. Follow his or her instructions. You should also: °· Call a friend or a family member and ask him or her to stay with you. °· Move to a cooler location, such as: °? Into the shade. °? In front of a fan. °? An air-conditioned space. °· Lie down and rest. °· Slowly drink nonalcoholic, caffeine-free fluids. °· Take off tight clothing or extra clothing. °· Take a cool bath or  shower, if possible. If you do not have access to a bath or shower, dab or mist cool water on your skin. ° °WHY IS IT IMPORTANT TO TREAT THIS CONDITION? °It is important to take care of yourself and treat heat exhaustion as soon as possible. Untreated heat exhaustion can turn into heat stroke, which is a life-threatening condition that requires urgent medical treatment. °HOW CAN I PREVENT THIS CONDITION? °To prevent this condition: °· Drink enough fluid to keep your urine clear or pale yellow. This helps your body to sweat properly. °· Avoid outdoor activities on very hot or humid days. °· Do not exercise or do other physical activity when you are not feeling well. °· Take breaks often during physical activity. °· Wear light-colored, loose-fitting, and lightweight clothing when it is hot outside. °· Wear a hat and use sunscreen when exercising outdoors. °· Avoid being outside during the hottest times of the day. °· Check with your health care provider before you start any new activity, especially if you take medicine or have a medical condition. °· Start any new activity slowly and work up to your fitness level. ° °HOW CAN I HELP TO PROTECT ELDERLY RELATIVES AND NEIGHBORS FROM THIS CONDITION? °People who are age 65 or older are at greater risk for heat exhaustion. Their bodies have a harder time adjusting to heat. They are also more likely to have a medical condition or be on medicines that increase their risk for heat exhaustion. They may get heat exhaustion   indoors if the heat is high for several days. You can help to protect them during hot weather by: °· Checking on them two or more times each day. °· Making sure that they are drinking plenty of cool, nonalcoholic, and caffeine-free fluids. °· Making sure that they use their air conditioner. °· Taking them to a location where air conditioning is available. °· Talking with their health care provider about their medical needs, medicines, and fluid  requirements. ° °SEEK MEDICAL CARE IF: °· Your symptoms last longer than 30 minutes. ° °SEEK IMMEDIATE MEDICAL CARE IF: °· You have any symptoms of heat stroke. These include: °? Fever. °? Vomiting. °? Red skin. °? Inability to sweat, resulting in hot, dry skin. °? Excessive thirst. °? Rapid breathing. °? Headache. °? Confusion or disorientation. °? Fainting. °? Seizures. °These symptoms may represent a serious problem that is an emergency. Do not wait to see if the symptoms will go away. Get medical help right away. Call your local emergency services (911 in the [place] Do not drive yourself to the hospital. °This information is not intended to replace advice given to you by your health care provider. Make sure you discuss any questions you have with your health care provider. °Document Released: 12/13/2007 Document Revised: 09/23/2015 Document Reviewed: 06/26/2015 °Elsevier Interactive Patient Education © 2018 Elsevier Inc. ° °

## 2017-10-21 NOTE — Assessment & Plan Note (Signed)
Symptoms completely resolved. In the future he will take precautions, pre-hydration with salt containing fluids, avoiding use of phentermine and keeping cool when he comes off of the field.

## 2017-10-21 NOTE — Assessment & Plan Note (Addendum)
Dorsal pain, sprain versus carpal avulsion. Velcro wrist brace. X-rays. Further management will depend on x-ray results but tentative follow-up in 2 to 3 weeks.  X-rays do confirm nondisplaced radial styloid fracture, intra-articular. Return in a week for cast placement. No fracture code billed.

## 2017-10-21 NOTE — Progress Notes (Signed)
Subjective:    CC: Couple of issues  HPI: Visual change: This occurred only once, after a long, busy day playing baseball in the sun, extensive sweating without much hydration, he also took a phentermine that day.  Once he got into the air conditioned environment, drink some Gatorade and had some food he felt better immediately.  No chest pain, shortness of breath.  Has not had any further symptoms since.  Unfortunately while playing baseball he went to catch a fly ball into the outfield, and fell on his right wrist hyperextending it.  Since then he said swelling, pain, inability to fully move the wrist.  I reviewed the past medical history, family history, social history, surgical history, and allergies today and no changes were needed.  Please see the problem list section below in epic for further details.  Past Medical History: No past medical history on file. Past Surgical History: No past surgical history on file. Social History: Social History   Socioeconomic History  . Marital status: Single    Spouse name: Not on file  . Number of children: Not on file  . Years of education: Not on file  . Highest education level: Not on file  Occupational History  . Not on file  Social Needs  . Financial resource strain: Not on file  . Food insecurity:    Worry: Not on file    Inability: Not on file  . Transportation needs:    Medical: Not on file    Non-medical: Not on file  Tobacco Use  . Smoking status: Never Smoker  . Smokeless tobacco: Never Used  Substance and Sexual Activity  . Alcohol use: Yes    Alcohol/week: 0.6 oz    Types: 1 Standard drinks or equivalent per week  . Drug use: No  . Sexual activity: Yes    Partners: Female  Lifestyle  . Physical activity:    Days per week: Not on file    Minutes per session: Not on file  . Stress: Not on file  Relationships  . Social connections:    Talks on phone: Not on file    Gets together: Not on file    Attends  religious service: Not on file    Active member of club or organization: Not on file    Attends meetings of clubs or organizations: Not on file    Relationship status: Not on file  Other Topics Concern  . Not on file  Social History Narrative  . Not on file   Family History: Family History  Problem Relation Age of Onset  . Alcoholism Father        mother  . Liver cancer Father   . Heart attack Unknown        grandfather   Allergies: No Known Allergies Medications: See med rec.  Review of Systems: No fevers, chills, night sweats, weight loss, chest pain, or shortness of breath.   Objective:    General: Well Developed, well nourished, and in no acute distress.  Neuro: Alert and oriented x3, extra-ocular muscles intact, sensation grossly intact.  HEENT: Normocephalic, atraumatic, pupils equal round reactive to light, neck supple, no masses, no lymphadenopathy, thyroid nonpalpable.  Skin: Warm and dry, no rashes. Cardiac: Regular rate and rhythm, no murmurs rubs or gallops, no lower extremity edema.  Respiratory: Clear to auscultation bilaterally. Not using accessory muscles, speaking in full sentences. Right wrist: Swollen with tenderness over the dorsal carpus and radiocarpal joint Range of motion very limited  due to pain and swelling. Tender to palpation over the dorsal radiocarpal joint and dorsal carpals. No snuffbox tenderness. No tenderness over Canal of Guyon. Strength 5/5 in all directions without pain. Negative tinel's and phalens signs. Negative Finkelstein sign. Negative Watson's test.  X-rays confirm a nondisplaced radial styloid fracture, intra-articular with no joint line incongruity.  Impression and Recommendations:    Heat exhaustion Symptoms completely resolved. In the future he will take precautions, pre-hydration with salt containing fluids, avoiding use of phentermine and keeping cool when he comes off of the field.   Nondisplaced fracture of right  radial styloid process, initial encounter for closed fracture Dorsal pain, sprain versus carpal avulsion. Velcro wrist brace. X-rays. Further management will depend on x-ray results but tentative follow-up in 2 to 3 weeks.  X-rays do confirm nondisplaced radial styloid fracture, intra-articular. Return in a week for cast placement. No fracture code billed. ___________________________________________ Ihor Austin. Benjamin Stain, M.D., ABFM., CAQSM. Primary Care and Sports Medicine [facility]  Adjunct Instructor of Family Medicine  [employer]

## 2017-10-28 ENCOUNTER — Ambulatory Visit: Payer: PRIVATE HEALTH INSURANCE | Admitting: Sports Medicine

## 2017-10-29 ENCOUNTER — Ambulatory Visit (INDEPENDENT_AMBULATORY_CARE_PROVIDER_SITE_OTHER): Payer: PRIVATE HEALTH INSURANCE | Admitting: Sports Medicine

## 2017-10-29 ENCOUNTER — Encounter: Payer: Self-pay | Admitting: Sports Medicine

## 2017-10-29 ENCOUNTER — Ambulatory Visit (INDEPENDENT_AMBULATORY_CARE_PROVIDER_SITE_OTHER): Payer: PRIVATE HEALTH INSURANCE

## 2017-10-29 DIAGNOSIS — S52514D Nondisplaced fracture of right radial styloid process, subsequent encounter for closed fracture with routine healing: Secondary | ICD-10-CM

## 2017-10-29 DIAGNOSIS — S52514A Nondisplaced fracture of right radial styloid process, initial encounter for closed fracture: Secondary | ICD-10-CM

## 2017-10-29 DIAGNOSIS — Y9364 Activity, baseball: Secondary | ICD-10-CM

## 2017-10-29 DIAGNOSIS — W19XXXD Unspecified fall, subsequent encounter: Secondary | ICD-10-CM

## 2017-10-29 NOTE — Assessment & Plan Note (Signed)
Radial styloid fracture, ulnar avulsion fracture. Transition into a Exos cast today. Continue this for 4 weeks and then return for recheck. Ordering a new set of x-rays today.

## 2017-10-29 NOTE — Progress Notes (Signed)
Subjective:    CC: Recheck fracture  HPI: 1 week ago this pleasant [id number] year old male injured his right arm, x-rays showed a fracture through the radial styloid as well as an avulsion fracture from the ulna.  Pain has improved considerably in a wrist brace, he returns for cast placement.  I reviewed the past medical history, family history, social history, surgical history, and allergies today and no changes were needed.  Please see the problem list section below in epic for further details.  Past Medical History: No past medical history on file. Past Surgical History: No past surgical history on file. Social History: Social History   Socioeconomic History  . Marital status: Single    Spouse name: Not on file  . Number of children: Not on file  . Years of education: Not on file  . Highest education level: Not on file  Occupational History  . Not on file  Social Needs  . Financial resource strain: Not on file  . Food insecurity:    Worry: Not on file    Inability: Not on file  . Transportation needs:    Medical: Not on file    Non-medical: Not on file  Tobacco Use  . Smoking status: Never Smoker  . Smokeless tobacco: Never Used  Substance and Sexual Activity  . Alcohol use: Yes    Alcohol/week: 1.0 standard drinks    Types: 1 Standard drinks or equivalent per week  . Drug use: No  . Sexual activity: Yes    Partners: Female  Lifestyle  . Physical activity:    Days per week: Not on file    Minutes per session: Not on file  . Stress: Not on file  Relationships  . Social connections:    Talks on phone: Not on file    Gets together: Not on file    Attends religious service: Not on file    Active member of club or organization: Not on file    Attends meetings of clubs or organizations: Not on file    Relationship status: Not on file  Other Topics Concern  . Not on file  Social History Narrative  . Not on file   Family History: Family History  Problem Relation  Age of Onset  . Alcoholism Father        mother  . Liver cancer Father   . Heart attack Unknown        grandfather   Allergies: No Known Allergies Medications: See med rec.  Review of Systems: No fevers, chills, night sweats, weight loss, chest pain, or shortness of breath.   Objective:    General: Well Developed, well nourished, and in no acute distress.  Neuro: Alert and oriented x3, extra-ocular muscles intact, sensation grossly intact.  HEENT: Normocephalic, atraumatic, pupils equal round reactive to light, neck supple, no masses, no lymphadenopathy, thyroid nonpalpable.  Skin: Warm and dry, no rashes. Cardiac: Regular rate and rhythm, no murmurs rubs or gallops, no lower extremity edema.  Respiratory: Clear to auscultation bilaterally. Not using accessory muscles, speaking in full sentences. Right wrist: Inspection normal with no visible erythema or swelling. ROM smooth and normal with good flexion and extension and ulnar/radial deviation that is symmetrical with opposite wrist. Palpation is normal over metacarpals, navicular, lunate, and TFCC; tendons without tenderness/ swelling No snuffbox tenderness. No tenderness over Canal of Guyon. Strength 5/5 in all directions without pain. Negative tinel's and phalens signs. Negative Finkelstein sign. Negative Watson's test.  Exos cast applied.  X-rays  personally reviewed, fracture fragments are well aligned, fracture line is still visible.  Impression and Recommendations:    Nondisplaced fracture of right radial styloid process, initial encounter for closed fracture Radial styloid fracture, ulnar avulsion fracture. Transition into a Exos cast today. Continue this for 4 weeks and then return for recheck. Ordering a new set of x-rays today.  I spent 25 minutes with this patient, greater than 50% was face-to-face time counseling regarding the above diagnoses ___________________________________________ Ihor Austinhomas J. Benjamin Stainhekkekandam,  M.D., ABFM., CAQSM. Primary Care and Sports Medicine [facility]  Adjunct Instructor of Family Medicine  [employer]

## 2017-11-04 ENCOUNTER — Ambulatory Visit: Payer: PRIVATE HEALTH INSURANCE | Admitting: Sports Medicine

## 2017-11-26 ENCOUNTER — Encounter: Payer: Self-pay | Admitting: Sports Medicine

## 2017-11-26 ENCOUNTER — Ambulatory Visit (INDEPENDENT_AMBULATORY_CARE_PROVIDER_SITE_OTHER): Payer: PRIVATE HEALTH INSURANCE | Admitting: Sports Medicine

## 2017-11-26 DIAGNOSIS — S52514A Nondisplaced fracture of right radial styloid process, initial encounter for closed fracture: Secondary | ICD-10-CM

## 2017-11-26 NOTE — Assessment & Plan Note (Addendum)
Doing much better now approximately 4-1/2 to 5 weeks post nondisplaced fracture of the right ulnar styloid and radial styloid processes. Has been in a Exos cast. No tenderness over the fracture site, strap with compressive dressing, he will wear this for the next 2 weeks, no softball for 2 weeks.   Return as needed.

## 2017-11-26 NOTE — Progress Notes (Signed)
Subjective:    CC: Recheck fracture  HPI: Jatin returns, he is a pleasant 37 year old male, approximately 5 weeks post right radial and ulnar styloid fractures, doing well.  Some stiffness but no pain.  I reviewed the past medical history, family history, social history, surgical history, and allergies today and no changes were needed.  Please see the problem list section below in epic for further details.  Past Medical History: No past medical history on file. Past Surgical History: No past surgical history on file. Social History: Social History   Socioeconomic History  . Marital status: Single    Spouse name: Not on file  . Number of children: Not on file  . Years of education: Not on file  . Highest education level: Not on file  Occupational History  . Not on file  Social Needs  . Financial resource strain: Not on file  . Food insecurity:    Worry: Not on file    Inability: Not on file  . Transportation needs:    Medical: Not on file    Non-medical: Not on file  Tobacco Use  . Smoking status: Never Smoker  . Smokeless tobacco: Never Used  Substance and Sexual Activity  . Alcohol use: Yes    Alcohol/week: 1.0 standard drinks    Types: 1 Standard drinks or equivalent per week  . Drug use: No  . Sexual activity: Yes    Partners: Female  Lifestyle  . Physical activity:    Days per week: Not on file    Minutes per session: Not on file  . Stress: Not on file  Relationships  . Social connections:    Talks on phone: Not on file    Gets together: Not on file    Attends religious service: Not on file    Active member of club or organization: Not on file    Attends meetings of clubs or organizations: Not on file    Relationship status: Not on file  Other Topics Concern  . Not on file  Social History Narrative  . Not on file   Family History: Family History  Problem Relation Age of Onset  . Alcoholism Father        mother  . Liver cancer Father   . Heart  attack Unknown        grandfather   Allergies: No Known Allergies Medications: See med rec.  Review of Systems: No fevers, chills, night sweats, weight loss, chest pain, or shortness of breath.   Objective:    General: Well Developed, well nourished, and in no acute distress.  Neuro: Alert and oriented x3, extra-ocular muscles intact, sensation grossly intact.  HEENT: Normocephalic, atraumatic, pupils equal round reactive to light, neck supple, no masses, no lymphadenopathy, thyroid nonpalpable.  Skin: Warm and dry, no rashes. Cardiac: Regular rate and rhythm, no murmurs rubs or gallops, no lower extremity edema.  Respiratory: Clear to auscultation bilaterally. Not using accessory muscles, speaking in full sentences. Right wrist: Inspection normal with no visible erythema or swelling. ROM smooth and normal with good flexion and extension and ulnar/radial deviation that is symmetrical with opposite wrist. Palpation is normal over metacarpals, navicular, lunate, and TFCC; tendons without tenderness/ swelling No snuffbox tenderness. No tenderness over Canal of Guyon. Strength 5/5 in all directions without pain. Negative tinel's and phalens signs. Negative Finkelstein sign. Negative Watson's test.  Impression and Recommendations:    Nondisplaced fracture of right radial styloid process, initial encounter for closed fracture Doing much better  now approximately 4-1/2 to 5 weeks post nondisplaced fracture of the right ulnar styloid and radial styloid processes. Has been in a Exos cast. No tenderness over the fracture site, strap with compressive dressing, he will wear this for the next 2 weeks, no softball for 2 weeks.   Return as needed. ___________________________________________ Ihor Austin. Benjamin Stain, M.D., ABFM., CAQSM. Primary Care and Sports Medicine [facility]  Adjunct Instructor of Family Medicine  [employer]

## 2018-04-01 ENCOUNTER — Other Ambulatory Visit: Payer: Self-pay | Admitting: Sports Medicine

## 2018-04-15 ENCOUNTER — Ambulatory Visit (INDEPENDENT_AMBULATORY_CARE_PROVIDER_SITE_OTHER): Payer: PRIVATE HEALTH INSURANCE | Admitting: Sports Medicine

## 2018-04-15 DIAGNOSIS — M25531 Pain in right wrist: Secondary | ICD-10-CM | POA: Diagnosis not present

## 2018-04-15 DIAGNOSIS — S52514A Nondisplaced fracture of right radial styloid process, initial encounter for closed fracture: Secondary | ICD-10-CM | POA: Diagnosis not present

## 2018-04-15 DIAGNOSIS — E6609 Other obesity due to excess calories: Secondary | ICD-10-CM

## 2018-04-15 DIAGNOSIS — G8929 Other chronic pain: Secondary | ICD-10-CM | POA: Diagnosis not present

## 2018-04-15 MED ORDER — PHENTERMINE HCL 37.5 MG PO TABS: ORAL_TABLET | ORAL | 0 refills | Status: DC

## 2018-04-15 NOTE — Assessment & Plan Note (Signed)
Pain at this time at the ulnar styloid. Repeat x-rays. Physical therapy. This is been present for decades, it is different from the pain of his previous radial styloid fracture. 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 If insufficient improvement over the next couple of months we will proceed with an MR arthrogram.

## 2018-04-15 NOTE — Progress Notes (Signed)
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  He has had multiple injuries to his right wrist, the most recent of which was a radial styloid fracture that healed completely.  On further questioning he has had multiple traumas, he is reporting chronic mechanical symptoms, popping just distal to the ulnar styloid process, minimally painful.  Reproduction of pain with ulnar deviation with terminal extension of the wrist.  In addition he would like to get back on weight loss medication.  I reviewed the past medical history, family history, social history, surgical history, and allergies today and no changes were needed.  Please see the problem list section below in epic for further details.  Past Medical History: No past medical history on file. Past Surgical History: No past surgical history on file. Social History: Social History   Socioeconomic History  . Marital status: Single    Spouse name: Not on file  . Number of children: Not on file  . Years of education: Not on file  . Highest education level: Not on file  Occupational History  . Not on file  Social Needs  . Financial resource strain: Not on file  . Food insecurity:    Worry: Not on file    Inability: Not on file  . Transportation needs:    Medical: Not on file    Non-medical: Not on file  Tobacco Use  . Smoking status: Never Smoker  . Smokeless tobacco: Never Used  Substance and Sexual Activity  . Alcohol use: Yes    Alcohol/week: 1.0 standard drinks    Types: 1 Standard drinks or equivalent per week  . Drug use: No  . Sexual activity: Yes    Partners: Female  Lifestyle  . Physical activity:    Days per week: Not on file    Minutes per session: Not on file  . Stress: Not on file  Relationships  . Social connections:    Talks on phone: Not on file    Gets together: Not on file    Attends religious service: Not on file    Active member of club or  organization: Not on file    Attends meetings of clubs or organizations: Not on file    Relationship status: Not on file  Other Topics Concern  . Not on file  Social History Narrative  . Not on file   Family History: Family History  Problem Relation Age of Onset  . Alcoholism Father        mother  . Liver cancer Father   . Heart attack Unknown        grandfather   Allergies: No Known Allergies Medications: See med rec.  Review of Systems: No fevers, chills, night sweats, weight loss, chest pain, or shortness of breath.   Objective:    General: Well Developed, well nourished, and in no acute distress.  Neuro: Alert and oriented x3, extra-ocular muscles intact, sensation grossly intact.  HEENT: Normocephalic, atraumatic, pupils equal round reactive to light, neck supple, no masses, no lymphadenopathy, thyroid nonpalpable.  Skin: Warm and dry, no rashes. Cardiac: Regular rate and rhythm, no murmurs rubs or gallops, no lower extremity edema.  Respiratory: Clear to auscultation bilaterally. Not using accessory muscles, speaking in full sentences. Right wrist: Inspection normal with no visible erythema or swelling. Painful with ulnar deviation of the wrist and terminal extension. Palpation is normal over metacarpals, navicular, lunate, and TFCC; tendons without tenderness/  swelling No snuffbox tenderness. No tenderness over Canal of Gu[place]. Strength 5/5 in all directions without pain. Negative tinel's and phalens signs. Negative Finkelstein sign. Negative Watson test, negative lunotriquetral shuck test.  Impression and Recommendations:    Wrist pain, chronic, right Pain at this time at the ulnar styloid. Repeat x-rays. Physical therapy. This is been present for decades, it is different from the pain of his previous radial styloid fracture. 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 If insufficient improvement over the next couple of months we will  proceed with an MR arthrogram.  Obesity Restarting phentermine, return monthly for weight checks and refills. ___________________________________________ Ihor Austinhomas J. Benjamin Stainhekkekandam, M.D., ABFM., CAQSM. Primary Care and Sports Medicine [facility] [place]  Adjunct Professor of Family Medicine  [employer]

## 2018-04-15 NOTE — Assessment & Plan Note (Signed)
Restarting phentermine, return monthly for weight checks and refills. 

## 2018-05-13 ENCOUNTER — Ambulatory Visit (INDEPENDENT_AMBULATORY_CARE_PROVIDER_SITE_OTHER): Payer: PRIVATE HEALTH INSURANCE | Admitting: Sports Medicine

## 2018-05-13 ENCOUNTER — Encounter: Payer: Self-pay | Admitting: Sports Medicine

## 2018-05-13 DIAGNOSIS — E6609 Other obesity due to excess calories: Secondary | ICD-10-CM | POA: Diagnosis not present

## 2018-05-13 MED ORDER — PHENTERMINE HCL 37.5 MG PO TABS: ORAL_TABLET | ORAL | 0 refills | Status: DC

## 2018-05-13 NOTE — Progress Notes (Signed)
  Subjective:    CC: Recheck  HPI: Tyne returns, he is lost good weight after the first month on phentermine.  He is endorsing some recurrent hunger at the end of the day.  I reviewed the past medical history, family history, social history, surgical history, and allergies today and no changes were needed.  Please see the problem list section below in epic for further details.  Past Medical History: No past medical history on file. Past Surgical History: No past surgical history on file. Social History: Social History   Socioeconomic History  . Marital status: Single    Spouse name: Not on file  . Number of children: Not on file  . Years of education: Not on file  . Highest education level: Not on file  Occupational History  . Not on file  Social Needs  . Financial resource strain: Not on file  . Food insecurity:    Worry: Not on file    Inability: Not on file  . Transportation needs:    Medical: Not on file    Non-medical: Not on file  Tobacco Use  . Smoking status: Never Smoker  . Smokeless tobacco: Never Used  Substance and Sexual Activity  . Alcohol use: Yes    Alcohol/week: 1.0 standard drinks    Types: 1 Standard drinks or equivalent per week  . Drug use: No  . Sexual activity: Yes    Partners: Female  Lifestyle  . Physical activity:    Days per week: Not on file    Minutes per session: Not on file  . Stress: Not on file  Relationships  . Social connections:    Talks on phone: Not on file    Gets together: Not on file    Attends religious service: Not on file    Active member of club or organization: Not on file    Attends meetings of clubs or organizations: Not on file    Relationship status: Not on file  Other Topics Concern  . Not on file  Social History Narrative  . Not on file   Family History: Family History  Problem Relation Age of Onset  . Alcoholism Father        mother  . Liver cancer Father   . Heart attack Unknown    grandfather   Allergies: No Known Allergies Medications: See med rec.  Review of Systems: No fevers, chills, night sweats, weight loss, chest pain, or shortness of breath.   Objective:    General: Well Developed, well nourished, and in no acute distress.  Neuro: Alert and oriented x3, extra-ocular muscles intact, sensation grossly intact.  HEENT: Normocephalic, atraumatic, pupils equal round reactive to light, neck supple, no masses, no lymphadenopathy, thyroid nonpalpable.  Skin: Warm and dry, no rashes. Cardiac: Regular rate and rhythm, no murmurs rubs or gallops, no lower extremity edema.  Respiratory: Clear to auscultation bilaterally. Not using accessory muscles, speaking in full sentences.  Impression and Recommendations:    Obesity 7 pound weight loss after the first month, entering the second month. He will go to 1/2 pill twice a day as he is getting some hunger at the end of the day. ___________________________________________ Ihor Austin. Benjamin Stain, M.D., ABFM., CAQSM. Primary Care and Sports Medicine [facility]  Adjunct Professor of Family Medicine  [employer]

## 2018-05-13 NOTE — Assessment & Plan Note (Signed)
7 pound weight loss after the first month, entering the second month. He will go to 1/2 pill twice a day as he is getting some hunger at the end of the day.

## 2018-06-10 ENCOUNTER — Ambulatory Visit: Payer: PRIVATE HEALTH INSURANCE | Admitting: Sports Medicine

## 2018-07-08 ENCOUNTER — Encounter: Payer: Self-pay | Admitting: Sports Medicine

## 2018-07-08 ENCOUNTER — Ambulatory Visit: Payer: PRIVATE HEALTH INSURANCE | Admitting: Sports Medicine

## 2018-07-08 ENCOUNTER — Ambulatory Visit (INDEPENDENT_AMBULATORY_CARE_PROVIDER_SITE_OTHER): Payer: PRIVATE HEALTH INSURANCE | Admitting: Sports Medicine

## 2018-07-08 DIAGNOSIS — E6609 Other obesity due to excess calories: Secondary | ICD-10-CM | POA: Diagnosis not present

## 2018-07-08 MED ORDER — PHENTERMINE HCL 37.5 MG PO TABS: ORAL_TABLET | ORAL | 0 refills | Status: AC

## 2018-07-08 NOTE — Assessment & Plan Note (Addendum)
13 pound weight loss the first month on phentermine, he did have a bit of hunger at the end of the day so he is doing 1/2 pill early in the morning and a second half pill some time before the afternoon. We did discuss some further details of weight loss, as well as schedule his next appointment. Return to see me in 1 month, he does want to do a single additional month.

## 2018-07-08 NOTE — Progress Notes (Signed)
Virtual Visit via Telephone   I connected with  Clarence Benjamin  on 07/08/18 by telephone/telehealth and verified that I am speaking with the correct person using two identifiers.   I discussed the limitations, risks, security and privacy concerns of performing an evaluation and management service by telephone, including the higher likelihood of inaccurate diagnosis and treatment, and the availability of in person appointments.  We also discussed the likely need of an additional face to face encounter for complete and high quality delivery of care.  I also discussed with the patient that there may be a patient responsible charge related to this service. The patient expressed understanding and wishes to proceed.  Provider location is either at home or medical facility. Patient location is at their home, different from provider location. People involved in care of the patient during this telehealth encounter were myself, my nurse/medical assistant, and my front office/scheduling team member.  Subjective:    CC: Weight check  HPI: Clarence Benjamin lost 13 pounds the first month on phentermine, he is doing well and happy.  I reviewed the past medical history, family history, social history, surgical history, and allergies today and no changes were needed.  Please see the problem list section below in epic for further details.  Past Medical History: No past medical history on file. Past Surgical History: No past surgical history on file. Social History: Social History   Socioeconomic History  . Marital status: Single    Spouse name: Not on file  . Number of children: Not on file  . Years of education: Not on file  . Highest education level: Not on file  Occupational History  . Not on file  Social Needs  . Financial resource strain: Not on file  . Food insecurity:    Worry: Not on file    Inability: Not on file  . Transportation needs:    Medical: Not on file    Non-medical: Not on file   Tobacco Use  . Smoking status: Never Smoker  . Smokeless tobacco: Never Used  Substance and Sexual Activity  . Alcohol use: Yes    Alcohol/week: 1.0 standard drinks    Types: 1 Standard drinks or equivalent per week  . Drug use: No  . Sexual activity: Yes    Partners: Female  Lifestyle  . Physical activity:    Days per week: Not on file    Minutes per session: Not on file  . Stress: Not on file  Relationships  . Social connections:    Talks on phone: Not on file    Gets together: Not on file    Attends religious service: Not on file    Active member of club or organization: Not on file    Attends meetings of clubs or organizations: Not on file    Relationship status: Not on file  Other Topics Concern  . Not on file  Social History Narrative  . Not on file   Family History: Family History  Problem Relation Age of Onset  . Alcoholism Father        mother  . Liver cancer Father   . Heart attack Unknown        grandfather   Allergies: No Known Allergies Medications: See med rec.  Review of Systems: No fevers, chills, night sweats, weight loss, chest pain, or shortness of breath.   Objective:    General: Speaking full sentences, no audible heavy breathing.  Sounds alert and appropriately interactive.  No other  physical exam performed due to the non-face to face nature of this visit.  Impression and Recommendations:    Obesity 13 pound weight loss the first month on phentermine, he did have a bit of hunger at the end of the day so he is doing 1/2 pill early in the morning and a second half pill some time before the afternoon. We did discuss some further details of weight loss, as well as schedule his next appointment. Return to see me in 1 month, he does want to do a single additional month.   I discussed the above assessment and treatment plan with the patient. The patient was provided an opportunity to ask questions and all were answered. The patient agreed with  the plan and demonstrated an understanding of the instructions.   The patient was advised to call back or seek an in-person evaluation if the symptoms worsen or if the condition fails to improve as anticipated.   I provided 25 minutes of non-face-to-face time during this encounter, 15 minutes of additional time was needed to gather information, review chart, records, communicate/coordinate with staff remotely, and complete documentation.   ___________________________________________ Ihor Austin. Benjamin Stain, M.D., ABFM., CAQSM. Primary Care and Sports Medicine [facility]  Adjunct Professor of Family Medicine  [employer]

## 2018-08-05 ENCOUNTER — Ambulatory Visit: Payer: PRIVATE HEALTH INSURANCE | Admitting: Sports Medicine

## 2019-03-18 IMAGING — DX DG WRIST COMPLETE 3+V*R*
4 series · 4 of 4 positions shown · non-contrast
Comparison: None.

CLINICAL DATA: Right-sided wrist pain following softball injury,
initial encounter

EXAM:
RIGHT WRIST - COMPLETE 3+ VIEW

[wrist pa]
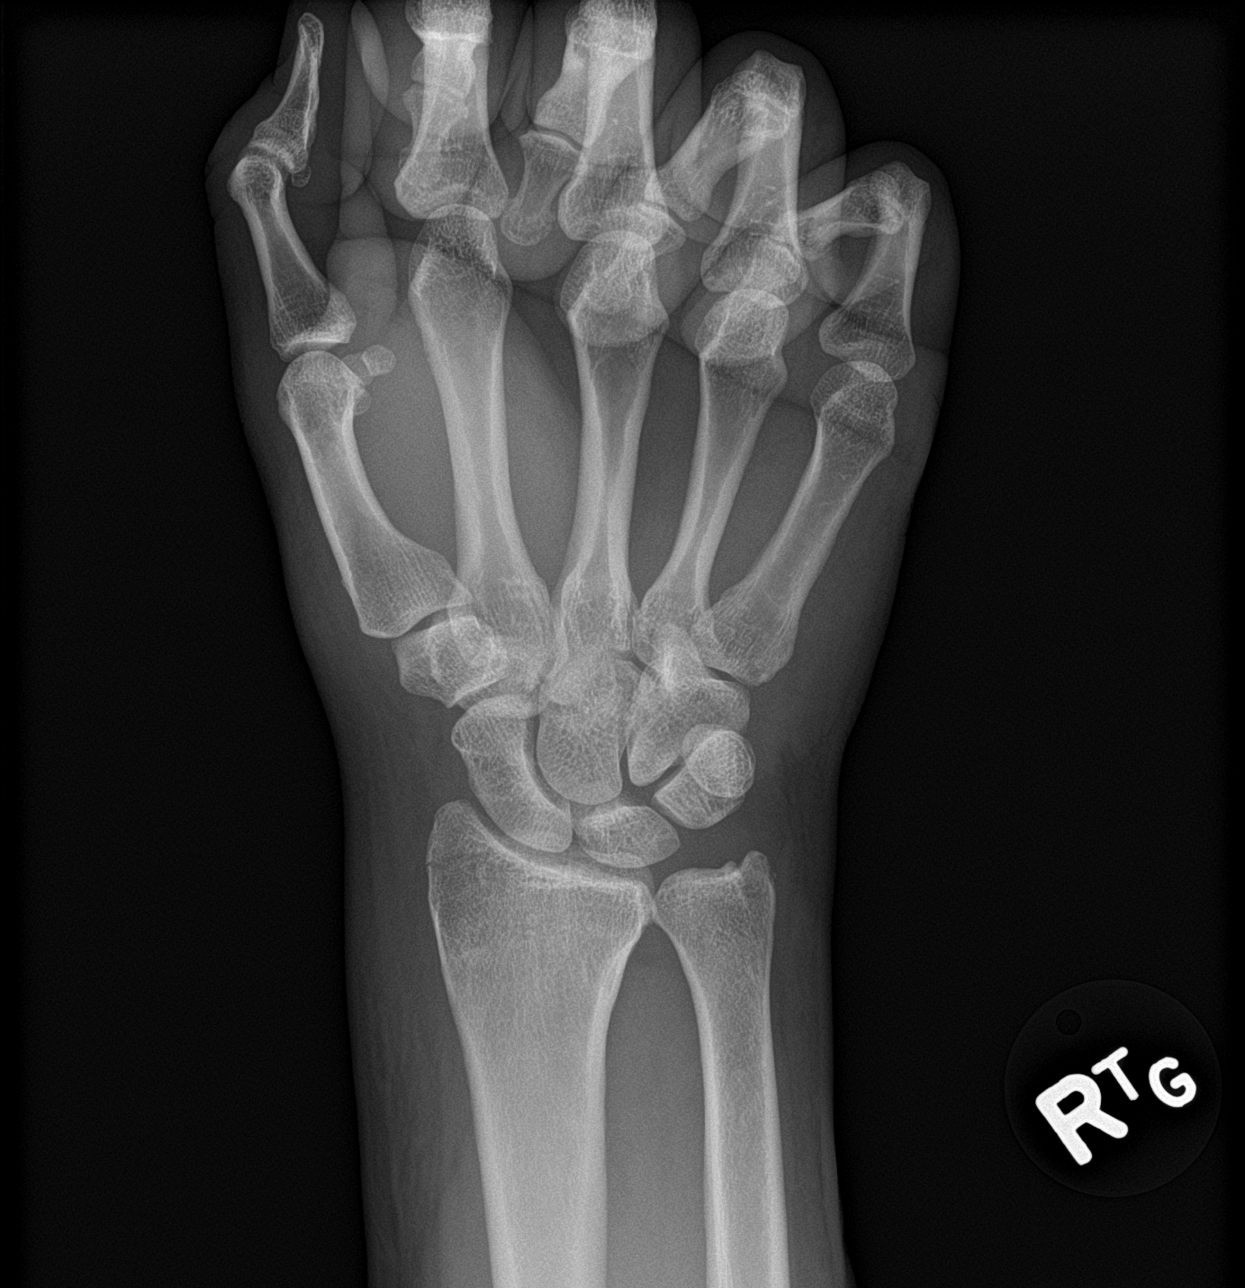

[wrist obl]
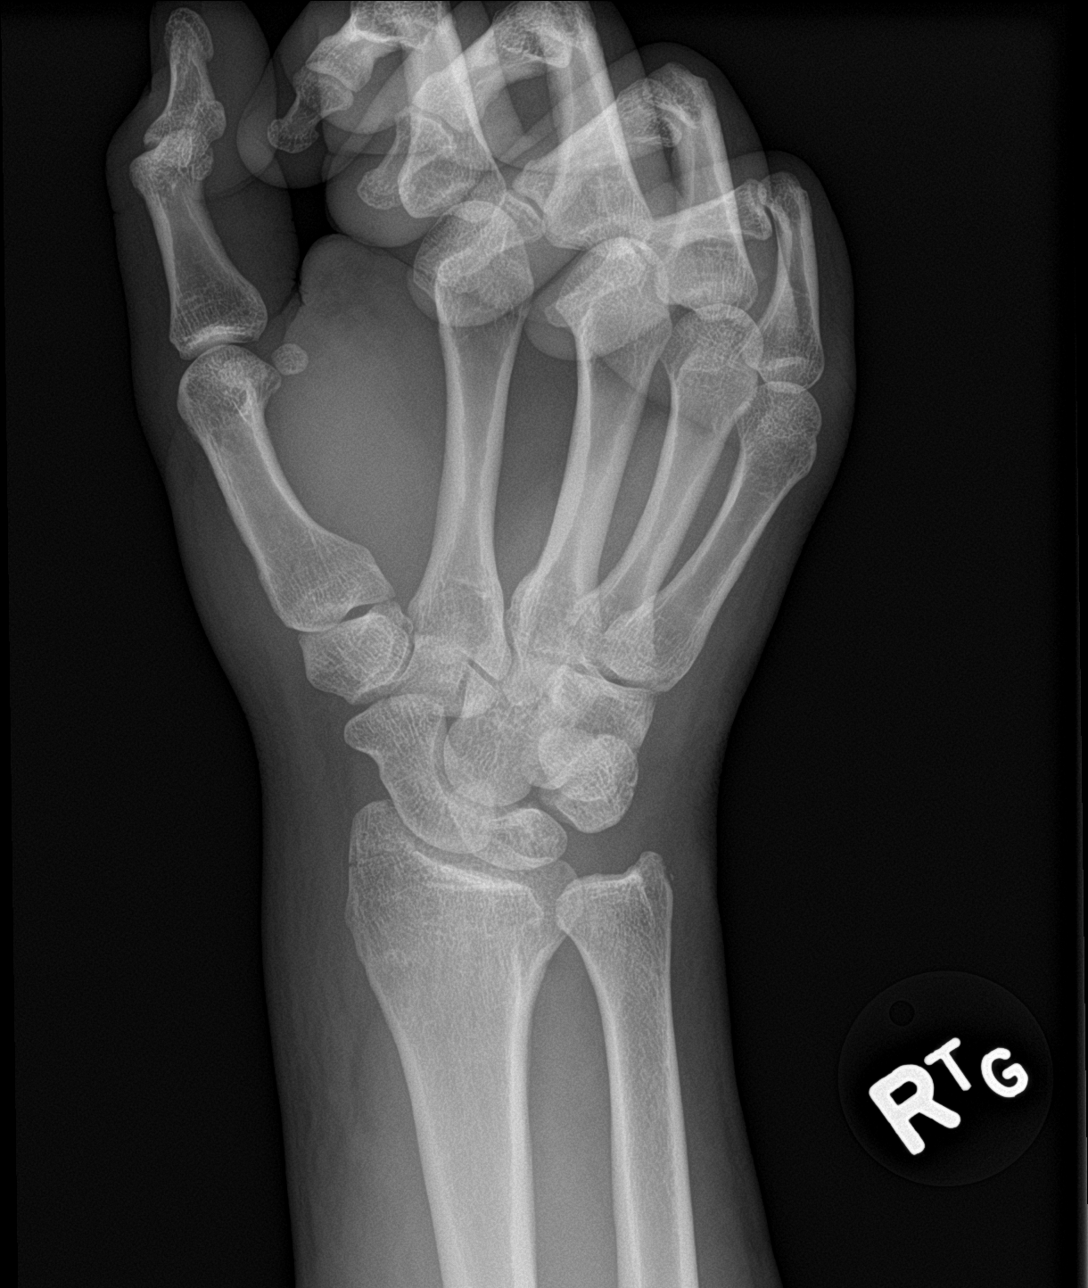

[wrist lat]
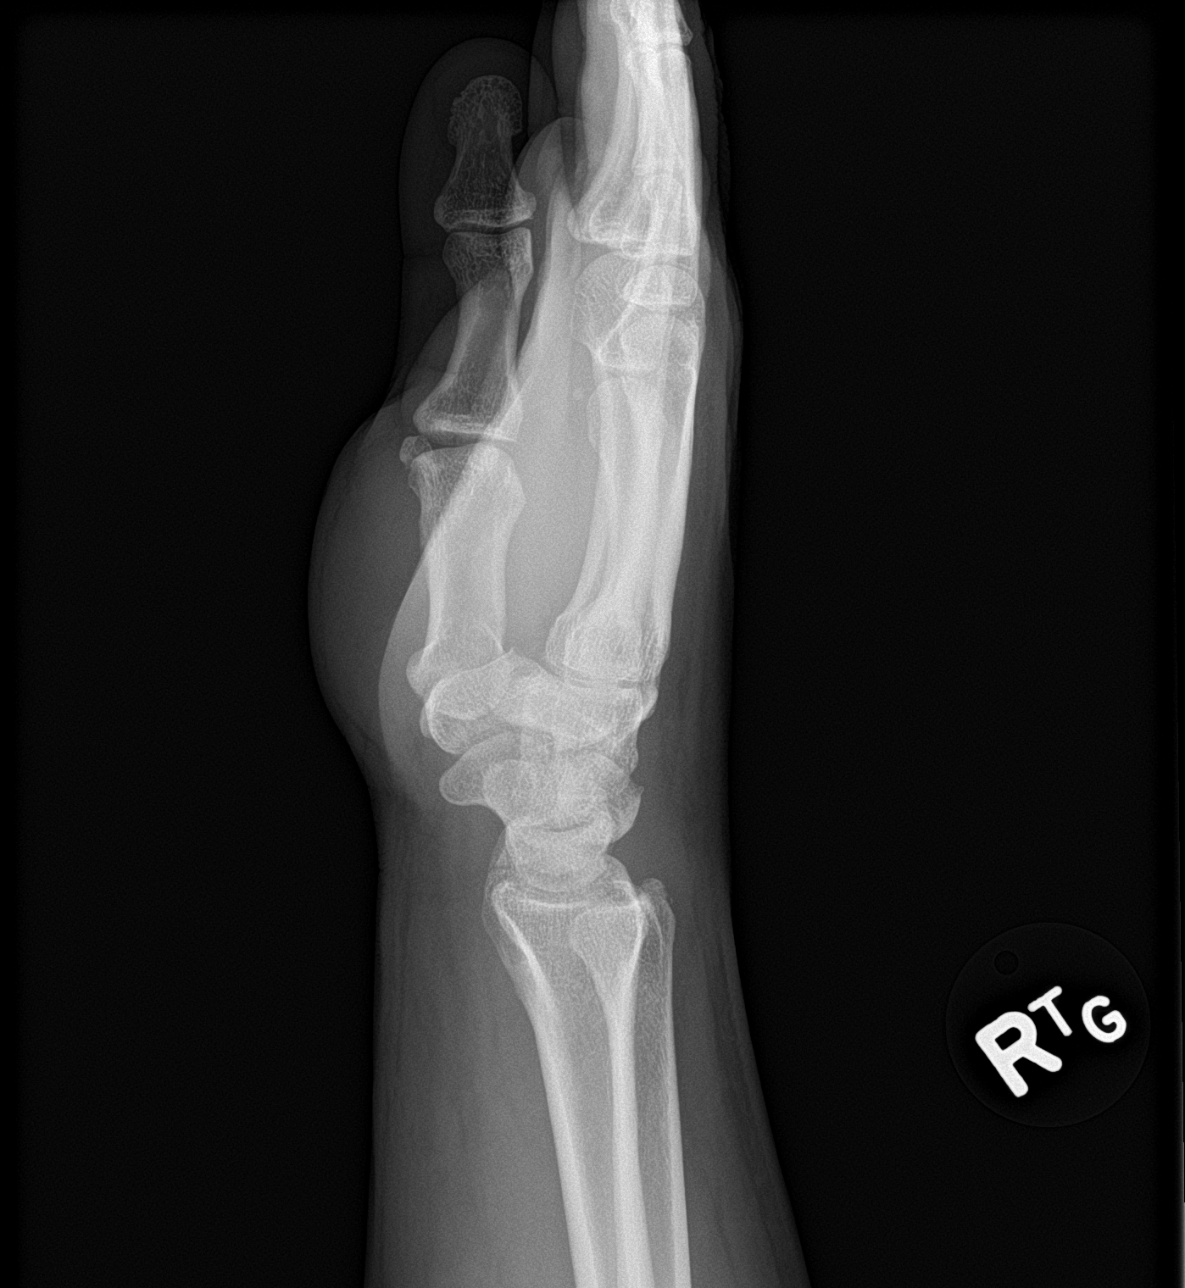

[wrist navicular]
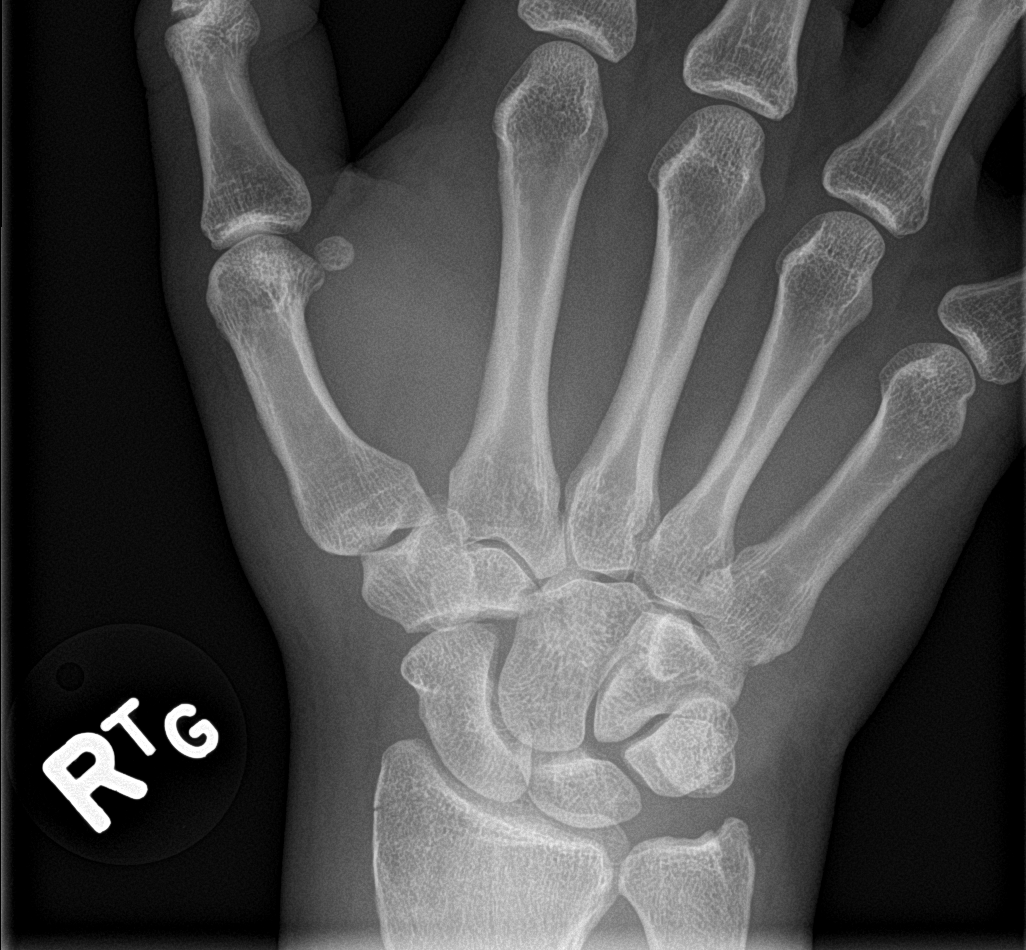

[4 of 4 positions shown; findings below may reference images not displayed]

FINDINGS: There is an undisplaced fracture of the distal radius laterally
which extends into the articular surface. Small avulsion from the
distal ulna is noted as well. No other fracture is seen. Soft tissue
swelling is noted.
IMPRESSION: Distal ulnar and radial fractures as described.

These results will be called to the ordering clinician or
representative by the Radiologist Assistant, and communication
documented in the PACS or zVision Dashboard.

## 2019-03-26 IMAGING — DX DG WRIST COMPLETE 3+V*R*
3 series · 3 of 3 positions shown · non-contrast
Comparison: 10/21/2017

CLINICAL DATA: Subsequent encounter for radial styloid process
fracture

EXAM:
RIGHT WRIST - COMPLETE 3+ VIEW

[wrist pa]
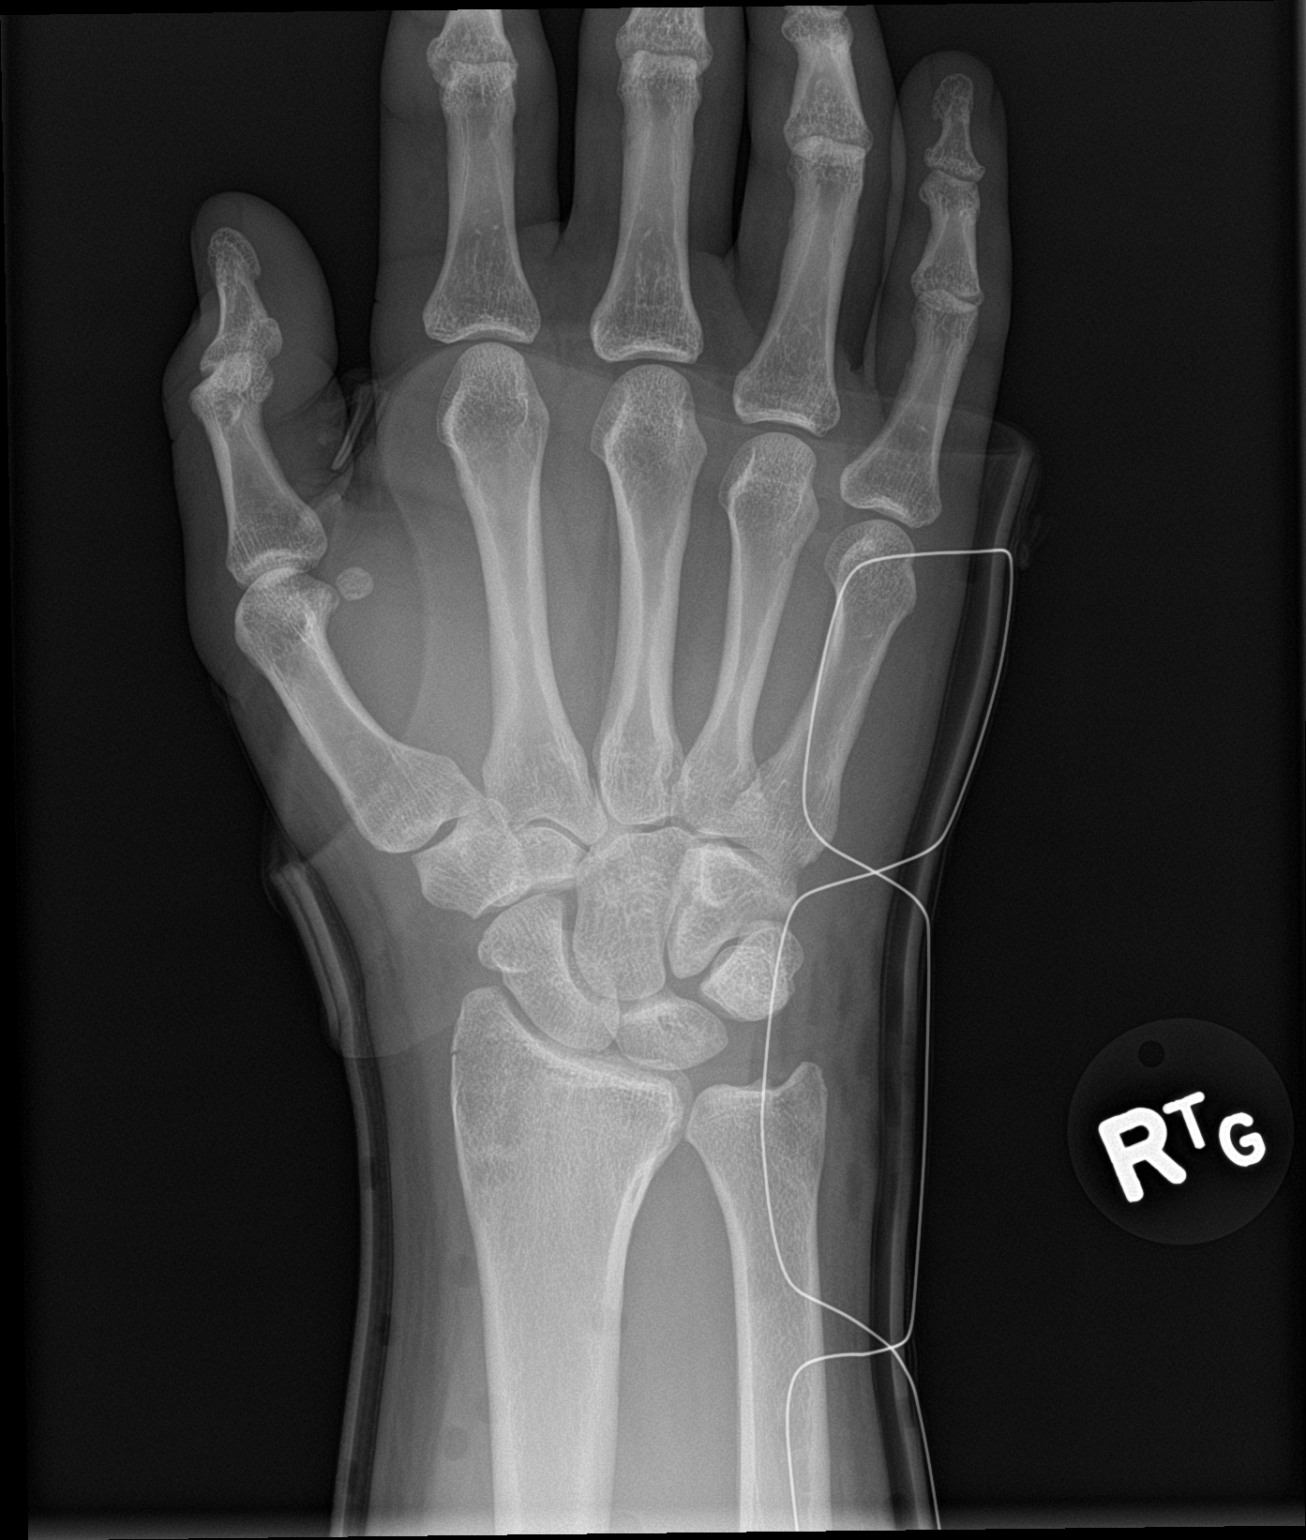

[wrist obl]
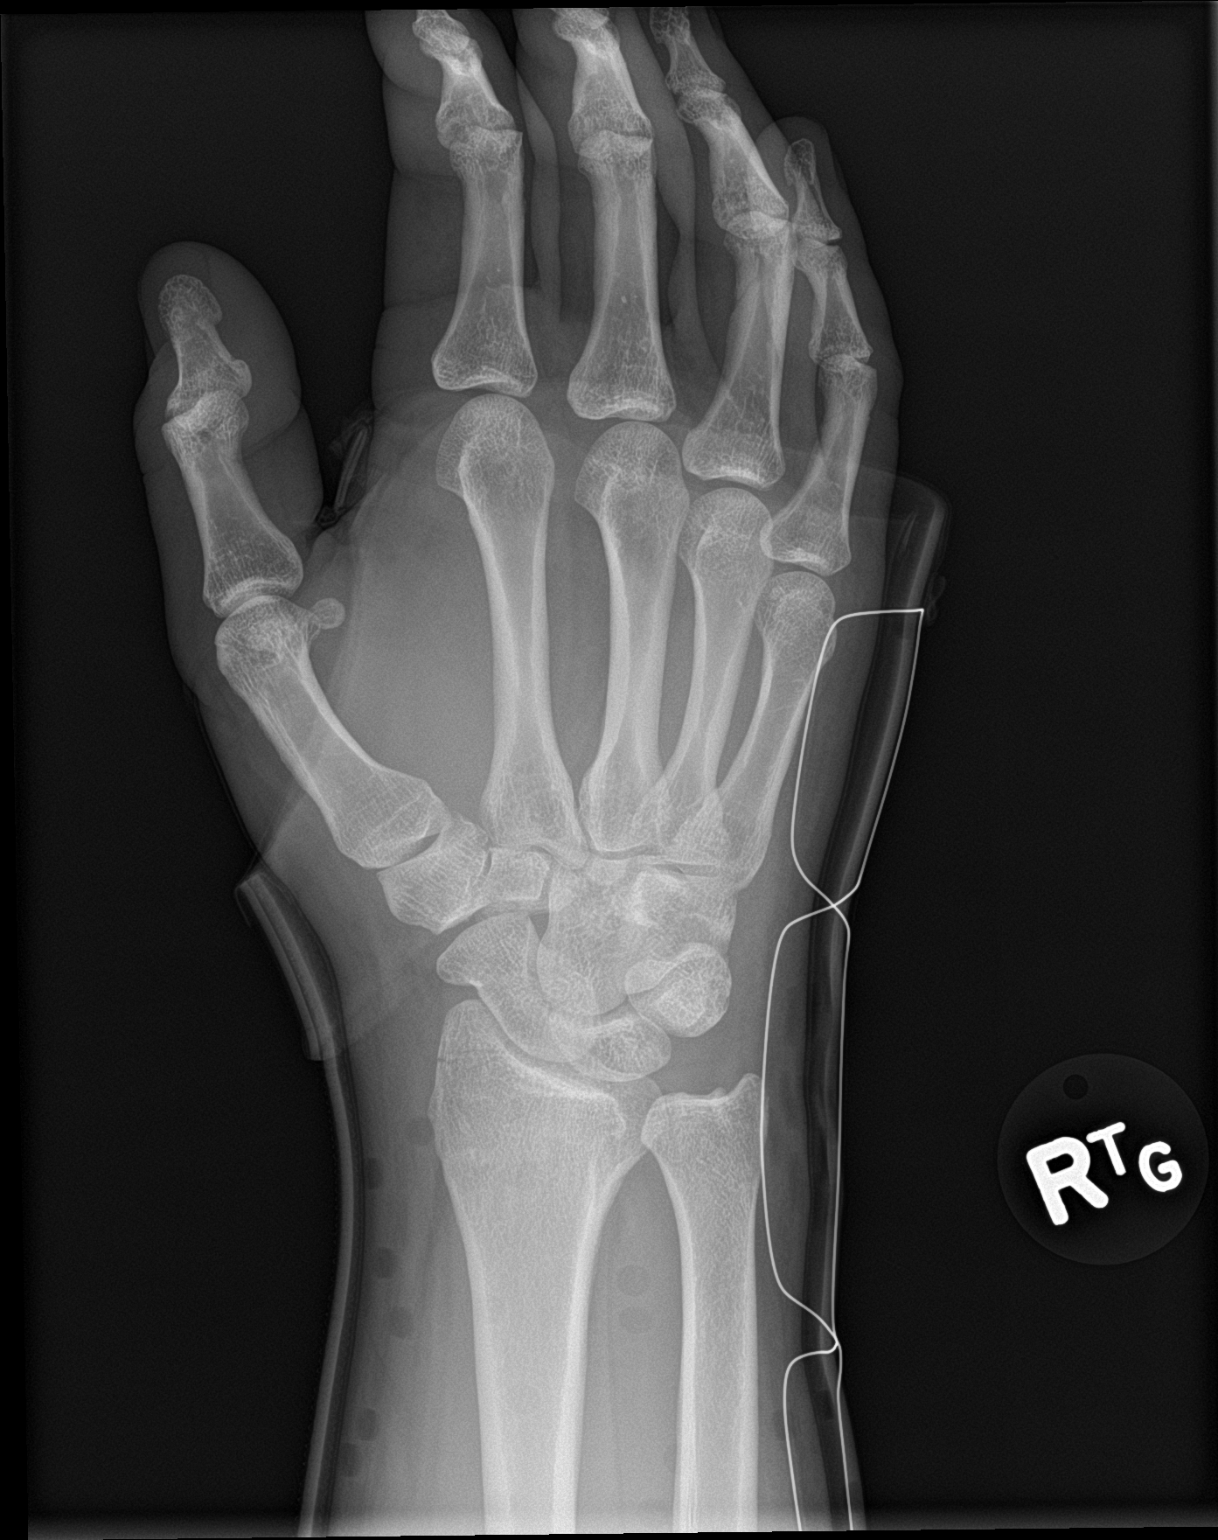

[wrist lat]
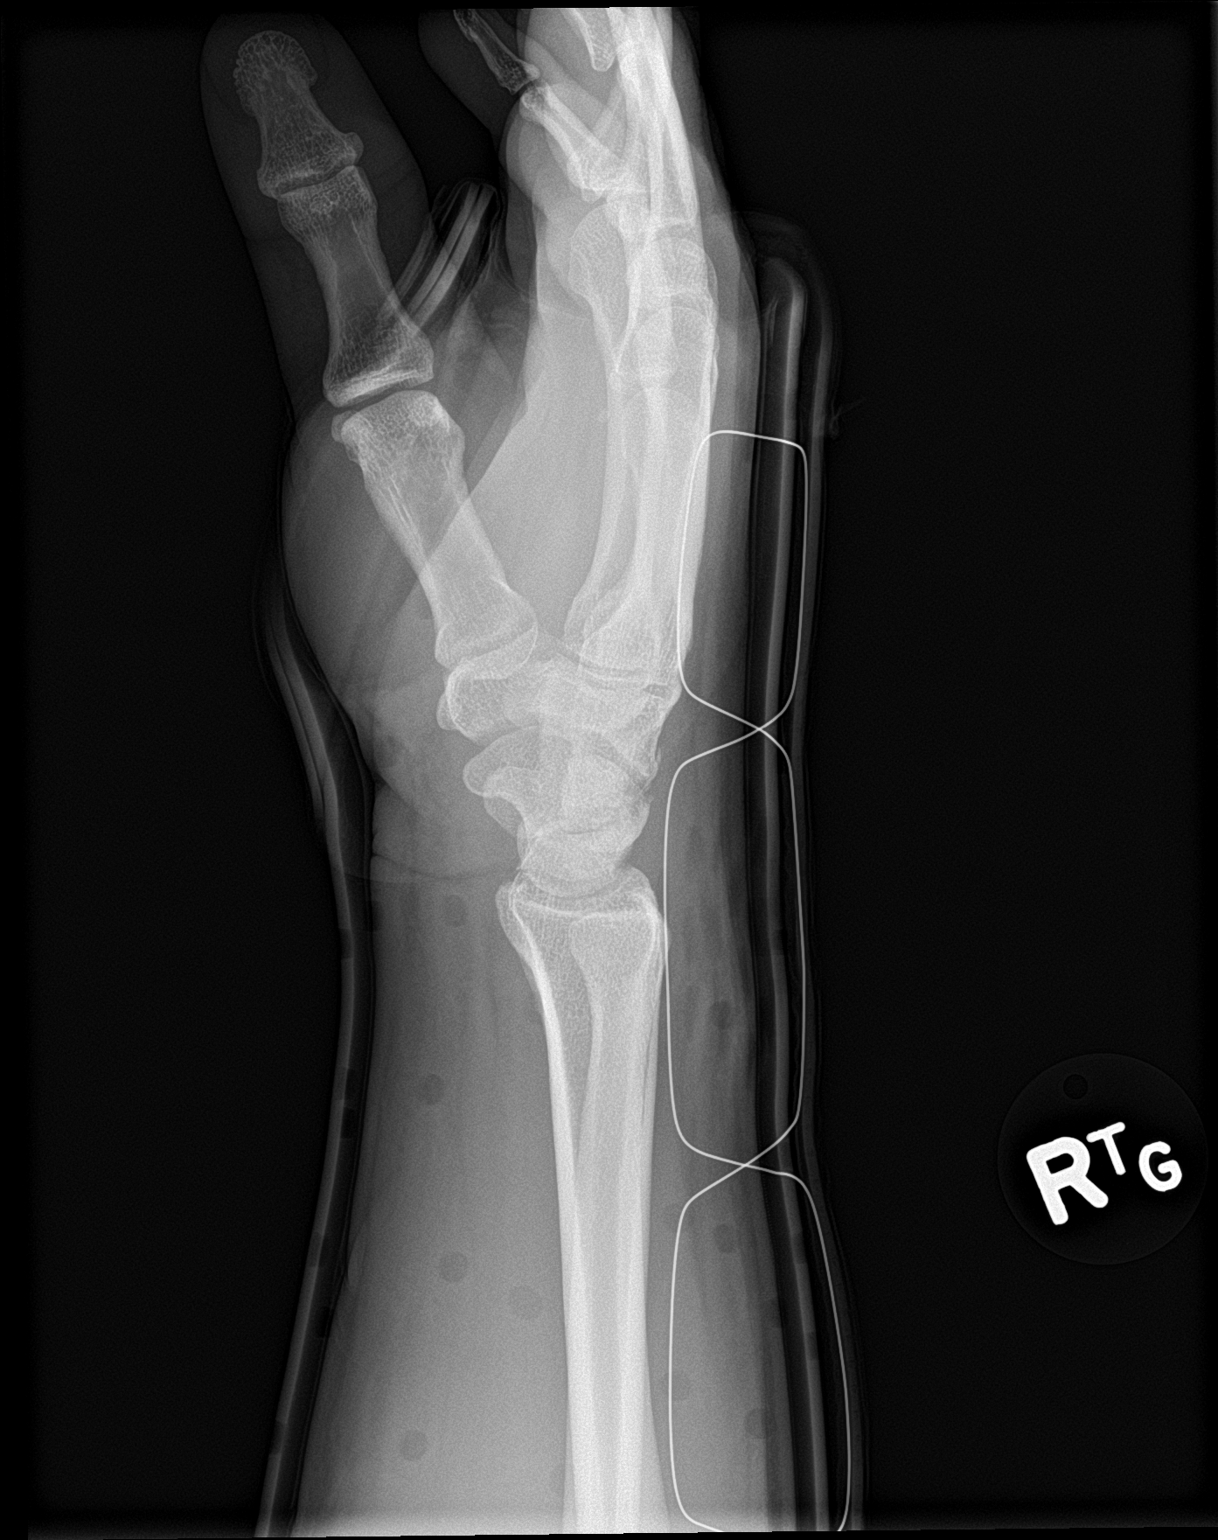

[3 of 3 positions shown; findings below may reference images not displayed]

FINDINGS: Nondisplaced fracture of the radial styloid process. No other
fracture or dislocation. Soft tissues are normal.
IMPRESSION: Nondisplaced fracture of the radial styloid process without
surrounding callus.

## 2020-03-22 ENCOUNTER — Encounter: Payer: PRIVATE HEALTH INSURANCE | Admitting: Sports Medicine
